# Patient Record
Sex: Female | Born: 1955
Health system: Southern US, Community
[De-identification: ages and names within clinical notes are randomized; demographics above are authoritative.]

## PROBLEM LIST (undated history)

## (undated) DIAGNOSIS — E785 Hyperlipidemia, unspecified: Secondary | ICD-10-CM

## (undated) DIAGNOSIS — I10 Essential (primary) hypertension: Secondary | ICD-10-CM

## (undated) DIAGNOSIS — F32A Depression, unspecified: Secondary | ICD-10-CM

## (undated) DIAGNOSIS — T7840XA Allergy, unspecified, initial encounter: Secondary | ICD-10-CM

## (undated) DIAGNOSIS — F329 Major depressive disorder, single episode, unspecified: Secondary | ICD-10-CM

## (undated) DIAGNOSIS — F419 Anxiety disorder, unspecified: Secondary | ICD-10-CM

## (undated) DIAGNOSIS — G5600 Carpal tunnel syndrome, unspecified upper limb: Secondary | ICD-10-CM

## (undated) DIAGNOSIS — M1990 Unspecified osteoarthritis, unspecified site: Secondary | ICD-10-CM

## (undated) DIAGNOSIS — J45909 Unspecified asthma, uncomplicated: Secondary | ICD-10-CM

## (undated) HISTORY — PX: OTHER SURGICAL HISTORY: SHX169

## (undated) HISTORY — DX: Depression, unspecified: F32.A

## (undated) HISTORY — DX: Major depressive disorder, single episode, unspecified: F32.9

## (undated) HISTORY — PX: DE QUERVAIN'S RELEASE: SHX1439

## (undated) HISTORY — DX: Anxiety disorder, unspecified: F41.9

## (undated) HISTORY — DX: Carpal tunnel syndrome, unspecified upper limb: G56.00

## (undated) HISTORY — DX: Unspecified osteoarthritis, unspecified site: M19.90

## (undated) HISTORY — DX: Hyperlipidemia, unspecified: E78.5

## (undated) HISTORY — PX: CHOLECYSTECTOMY: SHX55

## (undated) HISTORY — DX: Unspecified asthma, uncomplicated: J45.909

## (undated) HISTORY — DX: Essential (primary) hypertension: I10

## (undated) HISTORY — DX: Allergy, unspecified, initial encounter: T78.40XA

---

## 2003-06-07 ENCOUNTER — Other Ambulatory Visit: Payer: Self-pay

## 2004-06-11 ENCOUNTER — Encounter: Payer: Self-pay | Admitting: General Practice

## 2004-06-19 ENCOUNTER — Encounter: Payer: Self-pay | Admitting: General Practice

## 2005-11-15 ENCOUNTER — Emergency Department: Payer: Self-pay | Admitting: Emergency Medicine

## 2006-08-02 ENCOUNTER — Other Ambulatory Visit: Payer: Self-pay

## 2006-08-09 ENCOUNTER — Ambulatory Visit: Payer: Self-pay | Admitting: Surgery

## 2006-09-27 ENCOUNTER — Ambulatory Visit: Payer: Self-pay | Admitting: Unknown Physician Specialty

## 2006-09-30 ENCOUNTER — Ambulatory Visit: Payer: Self-pay | Admitting: Unknown Physician Specialty

## 2006-10-12 ENCOUNTER — Ambulatory Visit: Payer: Self-pay | Admitting: Unknown Physician Specialty

## 2007-11-14 ENCOUNTER — Ambulatory Visit: Payer: Self-pay | Admitting: Unknown Physician Specialty

## 2008-09-27 ENCOUNTER — Encounter: Admission: RE | Admit: 2008-09-27 | Discharge: 2008-09-27 | Payer: Self-pay | Admitting: Podiatry

## 2009-02-06 ENCOUNTER — Ambulatory Visit: Payer: Self-pay | Admitting: Unknown Physician Specialty

## 2010-06-03 ENCOUNTER — Ambulatory Visit: Payer: Self-pay | Admitting: Internal Medicine

## 2010-12-08 ENCOUNTER — Encounter: Payer: Self-pay | Admitting: Family Medicine

## 2010-12-08 ENCOUNTER — Ambulatory Visit (INDEPENDENT_AMBULATORY_CARE_PROVIDER_SITE_OTHER): Payer: Medicare Other | Admitting: Family Medicine

## 2010-12-08 VITALS — BP 113/77 | HR 82 | Ht 65.0 in | Wt 198.0 lb

## 2010-12-08 DIAGNOSIS — M25519 Pain in unspecified shoulder: Secondary | ICD-10-CM

## 2010-12-09 ENCOUNTER — Encounter: Payer: Self-pay | Admitting: Family Medicine

## 2010-12-09 NOTE — Progress Notes (Signed)
  Subjective:    Patient ID: Maureen Wiggins, female    DOB: 05-Jun-1956, 55 y.o.   MRN: [id number]  HPI  Maureen Wiggins is sent by her PCP, Dr Francesco Sor, for an ultrasound evaluation of her left shoulder for possible rotator cuff tear. She reports many issues for several years with her left upper extremity. She has been seen at [facility] and [facility] and has had surgical procedures for : radial nerve entrapment, de Quervains tenosynovitis and carpal tunnel issues---all on te left side. Recently she has been seen by Dr Ernest Pine for problems with left shoulder and elbow pain. Dr Ernest Pine is concerned that she may have a rotator cuff tear. She is unable to get an MRI because she has an implantable device in her spine.    Left shoulder pain is constant, worse with a lot of activity, especially anything over head. Gardening makes it much worse. She is right hand dominant. Not currently working as she is out on disability. Has had no specific trauma to the left shoulder. No unusual weakness of that arm or hand. N0 numbness or tingling in the arm or hand. Pain is mostly in anterior and central part of shoulder with some radiation to elbow and neck. No color changes noted in skin of that  extremity. No swelling.  Review of Systems Pertinent review of systems: negative for fever or unusual weight change.  Also  see HPI for pertinent issues regarding her chief complaint Objective:   Physical Exam     GEN WD female NAD NECK FROM with normal flexion and extension that is painless. Negative spur lings. No LAD. SHOULDERS: symmetrical with no sag. he left. Rotator cuff muscle strength is intact in all planes. Distally neurovascularly intact. Normal muscle bulk and tone of left upper extremity. Decreased IR on left--can barely get her hand behind her hip on the left and on the right she can easily reach L2 area.   [place] limited left shoulder: Rotator cuff muscles are intact. There is a small amount of calcification in the mid  substance of the supraspinatus consistent with old small tear that  has healed. The bicep tendon is visualized and intact. There is some impingement seen on dynamic testing. ha  1) left shoulder pain with some radiation to neck and elbow. Nothing on [place] that would indicate any current rotator cuff issues other than some mild impingement. She might benefit from a subacromial steroid injection and an exercise program for rotator cuff strengthening / IR stretches. I offered that today but she really wanted to discuss with her PCP. I will return her to his care and see her back prn.

## 2010-12-25 ENCOUNTER — Encounter: Payer: Self-pay | Admitting: General Practice

## 2011-01-18 ENCOUNTER — Encounter: Payer: Self-pay | Admitting: General Practice

## 2011-02-18 ENCOUNTER — Encounter: Payer: Self-pay | Admitting: General Practice

## 2011-05-28 ENCOUNTER — Emergency Department: Payer: Self-pay | Admitting: Emergency Medicine

## 2013-02-13 DIAGNOSIS — N951 Menopausal and female climacteric states: Secondary | ICD-10-CM | POA: Insufficient documentation

## 2014-03-08 ENCOUNTER — Ambulatory Visit: Payer: Self-pay | Admitting: Unknown Physician Specialty

## 2014-03-09 LAB — PATHOLOGY REPORT

## 2014-08-27 DIAGNOSIS — E785 Hyperlipidemia, unspecified: Secondary | ICD-10-CM | POA: Insufficient documentation

## 2015-03-26 ENCOUNTER — Other Ambulatory Visit: Payer: Self-pay | Admitting: Orthopedic Surgery

## 2015-03-26 DIAGNOSIS — G8929 Other chronic pain: Secondary | ICD-10-CM

## 2015-03-26 DIAGNOSIS — M545 Low back pain: Principal | ICD-10-CM

## 2015-03-29 ENCOUNTER — Ambulatory Visit
Admission: RE | Admit: 2015-03-29 | Discharge: 2015-03-29 | Disposition: A | Discharge status: Home / Self Care | Payer: PPO | Source: Ambulatory Visit | Attending: Orthopedic Surgery | Admitting: Orthopedic Surgery

## 2015-03-29 DIAGNOSIS — M545 Low back pain: Secondary | ICD-10-CM | POA: Insufficient documentation

## 2015-03-29 DIAGNOSIS — G8929 Other chronic pain: Secondary | ICD-10-CM | POA: Diagnosis present

## 2015-03-29 DIAGNOSIS — M4807 Spinal stenosis, lumbosacral region: Secondary | ICD-10-CM | POA: Diagnosis not present

## 2015-07-23 DIAGNOSIS — J3089 Other allergic rhinitis: Secondary | ICD-10-CM | POA: Diagnosis not present

## 2015-07-23 DIAGNOSIS — J3081 Allergic rhinitis due to animal (cat) (dog) hair and dander: Secondary | ICD-10-CM | POA: Diagnosis not present

## 2015-07-29 DIAGNOSIS — J3089 Other allergic rhinitis: Secondary | ICD-10-CM | POA: Diagnosis not present

## 2015-07-29 DIAGNOSIS — J3081 Allergic rhinitis due to animal (cat) (dog) hair and dander: Secondary | ICD-10-CM | POA: Diagnosis not present

## 2015-08-01 DIAGNOSIS — M5416 Radiculopathy, lumbar region: Secondary | ICD-10-CM | POA: Diagnosis not present

## 2015-08-01 DIAGNOSIS — M4806 Spinal stenosis, lumbar region: Secondary | ICD-10-CM | POA: Diagnosis not present

## 2015-08-04 DIAGNOSIS — M48061 Spinal stenosis, lumbar region without neurogenic claudication: Secondary | ICD-10-CM | POA: Insufficient documentation

## 2015-08-07 DIAGNOSIS — R05 Cough: Secondary | ICD-10-CM | POA: Diagnosis not present

## 2015-08-07 DIAGNOSIS — J019 Acute sinusitis, unspecified: Secondary | ICD-10-CM | POA: Diagnosis not present

## 2015-08-20 DIAGNOSIS — J3081 Allergic rhinitis due to animal (cat) (dog) hair and dander: Secondary | ICD-10-CM | POA: Diagnosis not present

## 2015-08-20 DIAGNOSIS — J3089 Other allergic rhinitis: Secondary | ICD-10-CM | POA: Diagnosis not present

## 2015-08-27 DIAGNOSIS — J3089 Other allergic rhinitis: Secondary | ICD-10-CM | POA: Diagnosis not present

## 2015-08-27 DIAGNOSIS — J3081 Allergic rhinitis due to animal (cat) (dog) hair and dander: Secondary | ICD-10-CM | POA: Diagnosis not present

## 2015-08-29 DIAGNOSIS — M5416 Radiculopathy, lumbar region: Secondary | ICD-10-CM | POA: Diagnosis not present

## 2015-09-17 DIAGNOSIS — J3089 Other allergic rhinitis: Secondary | ICD-10-CM | POA: Diagnosis not present

## 2015-09-17 DIAGNOSIS — J3081 Allergic rhinitis due to animal (cat) (dog) hair and dander: Secondary | ICD-10-CM | POA: Diagnosis not present

## 2015-09-24 DIAGNOSIS — J3081 Allergic rhinitis due to animal (cat) (dog) hair and dander: Secondary | ICD-10-CM | POA: Diagnosis not present

## 2015-09-24 DIAGNOSIS — J3089 Other allergic rhinitis: Secondary | ICD-10-CM | POA: Diagnosis not present

## 2015-09-27 DIAGNOSIS — I10 Essential (primary) hypertension: Secondary | ICD-10-CM | POA: Diagnosis not present

## 2015-09-27 DIAGNOSIS — M5416 Radiculopathy, lumbar region: Secondary | ICD-10-CM | POA: Diagnosis not present

## 2015-09-27 DIAGNOSIS — F419 Anxiety disorder, unspecified: Secondary | ICD-10-CM | POA: Diagnosis not present

## 2015-09-27 DIAGNOSIS — E785 Hyperlipidemia, unspecified: Secondary | ICD-10-CM | POA: Diagnosis not present

## 2015-10-01 DIAGNOSIS — J3081 Allergic rhinitis due to animal (cat) (dog) hair and dander: Secondary | ICD-10-CM | POA: Diagnosis not present

## 2015-10-01 DIAGNOSIS — J3089 Other allergic rhinitis: Secondary | ICD-10-CM | POA: Diagnosis not present

## 2015-10-04 DIAGNOSIS — M4727 Other spondylosis with radiculopathy, lumbosacral region: Secondary | ICD-10-CM | POA: Diagnosis not present

## 2015-10-04 DIAGNOSIS — M4726 Other spondylosis with radiculopathy, lumbar region: Secondary | ICD-10-CM | POA: Diagnosis not present

## 2015-10-04 DIAGNOSIS — M5116 Intervertebral disc disorders with radiculopathy, lumbar region: Secondary | ICD-10-CM | POA: Diagnosis not present

## 2015-10-04 DIAGNOSIS — M5416 Radiculopathy, lumbar region: Secondary | ICD-10-CM | POA: Diagnosis not present

## 2015-10-10 DIAGNOSIS — J3081 Allergic rhinitis due to animal (cat) (dog) hair and dander: Secondary | ICD-10-CM | POA: Diagnosis not present

## 2015-10-10 DIAGNOSIS — J3089 Other allergic rhinitis: Secondary | ICD-10-CM | POA: Diagnosis not present

## 2015-10-22 DIAGNOSIS — B9689 Other specified bacterial agents as the cause of diseases classified elsewhere: Secondary | ICD-10-CM | POA: Diagnosis not present

## 2015-10-22 DIAGNOSIS — J019 Acute sinusitis, unspecified: Secondary | ICD-10-CM | POA: Diagnosis not present

## 2015-10-22 DIAGNOSIS — G8929 Other chronic pain: Secondary | ICD-10-CM | POA: Diagnosis not present

## 2015-10-22 DIAGNOSIS — M79602 Pain in left arm: Secondary | ICD-10-CM | POA: Diagnosis not present

## 2015-10-22 DIAGNOSIS — F411 Generalized anxiety disorder: Secondary | ICD-10-CM | POA: Diagnosis not present

## 2015-10-24 DIAGNOSIS — J3089 Other allergic rhinitis: Secondary | ICD-10-CM | POA: Diagnosis not present

## 2015-10-24 DIAGNOSIS — J3081 Allergic rhinitis due to animal (cat) (dog) hair and dander: Secondary | ICD-10-CM | POA: Diagnosis not present

## 2015-10-29 DIAGNOSIS — J3081 Allergic rhinitis due to animal (cat) (dog) hair and dander: Secondary | ICD-10-CM | POA: Diagnosis not present

## 2015-10-29 DIAGNOSIS — J3089 Other allergic rhinitis: Secondary | ICD-10-CM | POA: Diagnosis not present

## 2015-10-31 DIAGNOSIS — M5416 Radiculopathy, lumbar region: Secondary | ICD-10-CM | POA: Diagnosis not present

## 2015-11-05 DIAGNOSIS — J3081 Allergic rhinitis due to animal (cat) (dog) hair and dander: Secondary | ICD-10-CM | POA: Diagnosis not present

## 2015-11-05 DIAGNOSIS — J3089 Other allergic rhinitis: Secondary | ICD-10-CM | POA: Diagnosis not present

## 2015-11-13 DIAGNOSIS — J0100 Acute maxillary sinusitis, unspecified: Secondary | ICD-10-CM | POA: Diagnosis not present

## 2015-11-19 DIAGNOSIS — J3089 Other allergic rhinitis: Secondary | ICD-10-CM | POA: Diagnosis not present

## 2015-11-19 DIAGNOSIS — J3081 Allergic rhinitis due to animal (cat) (dog) hair and dander: Secondary | ICD-10-CM | POA: Diagnosis not present

## 2015-11-21 DIAGNOSIS — I10 Essential (primary) hypertension: Secondary | ICD-10-CM | POA: Diagnosis not present

## 2015-11-21 DIAGNOSIS — M5416 Radiculopathy, lumbar region: Secondary | ICD-10-CM | POA: Diagnosis not present

## 2015-11-21 DIAGNOSIS — Z01818 Encounter for other preprocedural examination: Secondary | ICD-10-CM | POA: Diagnosis not present

## 2015-11-21 DIAGNOSIS — F418 Other specified anxiety disorders: Secondary | ICD-10-CM | POA: Diagnosis not present

## 2015-11-21 DIAGNOSIS — Z419 Encounter for procedure for purposes other than remedying health state, unspecified: Secondary | ICD-10-CM | POA: Diagnosis not present

## 2015-11-21 DIAGNOSIS — K219 Gastro-esophageal reflux disease without esophagitis: Secondary | ICD-10-CM | POA: Diagnosis not present

## 2015-11-21 DIAGNOSIS — Z01812 Encounter for preprocedural laboratory examination: Secondary | ICD-10-CM | POA: Diagnosis not present

## 2015-11-26 DIAGNOSIS — J3089 Other allergic rhinitis: Secondary | ICD-10-CM | POA: Diagnosis not present

## 2015-12-11 DIAGNOSIS — M5127 Other intervertebral disc displacement, lumbosacral region: Secondary | ICD-10-CM | POA: Diagnosis not present

## 2015-12-11 DIAGNOSIS — F419 Anxiety disorder, unspecified: Secondary | ICD-10-CM | POA: Diagnosis not present

## 2015-12-11 DIAGNOSIS — M5416 Radiculopathy, lumbar region: Secondary | ICD-10-CM | POA: Diagnosis not present

## 2015-12-11 DIAGNOSIS — K219 Gastro-esophageal reflux disease without esophagitis: Secondary | ICD-10-CM | POA: Diagnosis not present

## 2015-12-11 DIAGNOSIS — M5136 Other intervertebral disc degeneration, lumbar region: Secondary | ICD-10-CM | POA: Diagnosis not present

## 2015-12-11 DIAGNOSIS — E785 Hyperlipidemia, unspecified: Secondary | ICD-10-CM | POA: Diagnosis not present

## 2015-12-11 DIAGNOSIS — I10 Essential (primary) hypertension: Secondary | ICD-10-CM | POA: Diagnosis not present

## 2015-12-11 DIAGNOSIS — M5117 Intervertebral disc disorders with radiculopathy, lumbosacral region: Secondary | ICD-10-CM | POA: Diagnosis not present

## 2015-12-11 DIAGNOSIS — Z87891 Personal history of nicotine dependence: Secondary | ICD-10-CM | POA: Diagnosis not present

## 2015-12-11 DIAGNOSIS — F329 Major depressive disorder, single episode, unspecified: Secondary | ICD-10-CM | POA: Diagnosis not present

## 2015-12-11 DIAGNOSIS — Z79899 Other long term (current) drug therapy: Secondary | ICD-10-CM | POA: Diagnosis not present

## 2015-12-12 DIAGNOSIS — K219 Gastro-esophageal reflux disease without esophagitis: Secondary | ICD-10-CM | POA: Diagnosis not present

## 2015-12-12 DIAGNOSIS — F419 Anxiety disorder, unspecified: Secondary | ICD-10-CM | POA: Diagnosis not present

## 2015-12-12 DIAGNOSIS — M5416 Radiculopathy, lumbar region: Secondary | ICD-10-CM | POA: Diagnosis not present

## 2015-12-12 DIAGNOSIS — E785 Hyperlipidemia, unspecified: Secondary | ICD-10-CM | POA: Diagnosis not present

## 2015-12-12 DIAGNOSIS — I10 Essential (primary) hypertension: Secondary | ICD-10-CM | POA: Diagnosis not present

## 2015-12-26 DIAGNOSIS — I10 Essential (primary) hypertension: Secondary | ICD-10-CM | POA: Diagnosis not present

## 2015-12-26 DIAGNOSIS — M5417 Radiculopathy, lumbosacral region: Secondary | ICD-10-CM | POA: Diagnosis not present

## 2015-12-26 DIAGNOSIS — M5416 Radiculopathy, lumbar region: Secondary | ICD-10-CM | POA: Diagnosis not present

## 2015-12-26 DIAGNOSIS — Z87891 Personal history of nicotine dependence: Secondary | ICD-10-CM | POA: Diagnosis not present

## 2015-12-26 DIAGNOSIS — Z8249 Family history of ischemic heart disease and other diseases of the circulatory system: Secondary | ICD-10-CM | POA: Diagnosis not present

## 2015-12-27 DIAGNOSIS — R339 Retention of urine, unspecified: Secondary | ICD-10-CM | POA: Diagnosis not present

## 2016-01-02 DIAGNOSIS — N951 Menopausal and female climacteric states: Secondary | ICD-10-CM | POA: Diagnosis not present

## 2016-01-02 DIAGNOSIS — Z79890 Hormone replacement therapy (postmenopausal): Secondary | ICD-10-CM | POA: Diagnosis not present

## 2016-01-02 DIAGNOSIS — Z90710 Acquired absence of both cervix and uterus: Secondary | ICD-10-CM | POA: Diagnosis not present

## 2016-01-02 DIAGNOSIS — F329 Major depressive disorder, single episode, unspecified: Secondary | ICD-10-CM | POA: Diagnosis not present

## 2016-01-02 DIAGNOSIS — Z0000 Encounter for general adult medical examination without abnormal findings: Secondary | ICD-10-CM | POA: Diagnosis not present

## 2016-01-07 DIAGNOSIS — J3081 Allergic rhinitis due to animal (cat) (dog) hair and dander: Secondary | ICD-10-CM | POA: Diagnosis not present

## 2016-01-07 DIAGNOSIS — J3089 Other allergic rhinitis: Secondary | ICD-10-CM | POA: Diagnosis not present

## 2016-01-14 DIAGNOSIS — J3089 Other allergic rhinitis: Secondary | ICD-10-CM | POA: Diagnosis not present

## 2016-01-14 DIAGNOSIS — J3081 Allergic rhinitis due to animal (cat) (dog) hair and dander: Secondary | ICD-10-CM | POA: Diagnosis not present

## 2016-01-16 DIAGNOSIS — Z9889 Other specified postprocedural states: Secondary | ICD-10-CM | POA: Diagnosis not present

## 2016-01-16 DIAGNOSIS — J3089 Other allergic rhinitis: Secondary | ICD-10-CM | POA: Diagnosis not present

## 2016-01-16 DIAGNOSIS — M5416 Radiculopathy, lumbar region: Secondary | ICD-10-CM | POA: Diagnosis not present

## 2016-01-16 DIAGNOSIS — J3081 Allergic rhinitis due to animal (cat) (dog) hair and dander: Secondary | ICD-10-CM | POA: Diagnosis not present

## 2016-01-23 DIAGNOSIS — J3089 Other allergic rhinitis: Secondary | ICD-10-CM | POA: Diagnosis not present

## 2016-01-23 DIAGNOSIS — J3081 Allergic rhinitis due to animal (cat) (dog) hair and dander: Secondary | ICD-10-CM | POA: Diagnosis not present

## 2016-01-28 DIAGNOSIS — J3081 Allergic rhinitis due to animal (cat) (dog) hair and dander: Secondary | ICD-10-CM | POA: Diagnosis not present

## 2016-01-28 DIAGNOSIS — J3089 Other allergic rhinitis: Secondary | ICD-10-CM | POA: Diagnosis not present

## 2016-02-04 DIAGNOSIS — N398 Other specified disorders of urinary system: Secondary | ICD-10-CM | POA: Diagnosis not present

## 2016-02-04 DIAGNOSIS — R3914 Feeling of incomplete bladder emptying: Secondary | ICD-10-CM | POA: Diagnosis not present

## 2016-02-13 DIAGNOSIS — J3081 Allergic rhinitis due to animal (cat) (dog) hair and dander: Secondary | ICD-10-CM | POA: Diagnosis not present

## 2016-02-13 DIAGNOSIS — J3089 Other allergic rhinitis: Secondary | ICD-10-CM | POA: Diagnosis not present

## 2016-02-18 DIAGNOSIS — J3081 Allergic rhinitis due to animal (cat) (dog) hair and dander: Secondary | ICD-10-CM | POA: Diagnosis not present

## 2016-02-18 DIAGNOSIS — J3089 Other allergic rhinitis: Secondary | ICD-10-CM | POA: Diagnosis not present

## 2016-02-27 DIAGNOSIS — Z9889 Other specified postprocedural states: Secondary | ICD-10-CM | POA: Diagnosis not present

## 2016-03-03 DIAGNOSIS — H1045 Other chronic allergic conjunctivitis: Secondary | ICD-10-CM | POA: Diagnosis not present

## 2016-03-03 DIAGNOSIS — J4530 Mild persistent asthma, uncomplicated: Secondary | ICD-10-CM | POA: Diagnosis not present

## 2016-03-03 DIAGNOSIS — J3081 Allergic rhinitis due to animal (cat) (dog) hair and dander: Secondary | ICD-10-CM | POA: Diagnosis not present

## 2016-03-03 DIAGNOSIS — J3089 Other allergic rhinitis: Secondary | ICD-10-CM | POA: Diagnosis not present

## 2016-03-12 DIAGNOSIS — J3089 Other allergic rhinitis: Secondary | ICD-10-CM | POA: Diagnosis not present

## 2016-03-12 DIAGNOSIS — J3081 Allergic rhinitis due to animal (cat) (dog) hair and dander: Secondary | ICD-10-CM | POA: Diagnosis not present

## 2016-03-17 DIAGNOSIS — J3089 Other allergic rhinitis: Secondary | ICD-10-CM | POA: Diagnosis not present

## 2016-03-17 DIAGNOSIS — J3081 Allergic rhinitis due to animal (cat) (dog) hair and dander: Secondary | ICD-10-CM | POA: Diagnosis not present

## 2016-03-24 DIAGNOSIS — J3089 Other allergic rhinitis: Secondary | ICD-10-CM | POA: Diagnosis not present

## 2016-03-24 DIAGNOSIS — J3081 Allergic rhinitis due to animal (cat) (dog) hair and dander: Secondary | ICD-10-CM | POA: Diagnosis not present

## 2016-04-01 DIAGNOSIS — I10 Essential (primary) hypertension: Secondary | ICD-10-CM | POA: Diagnosis not present

## 2016-04-01 DIAGNOSIS — Z0000 Encounter for general adult medical examination without abnormal findings: Secondary | ICD-10-CM | POA: Diagnosis not present

## 2016-04-07 DIAGNOSIS — J3089 Other allergic rhinitis: Secondary | ICD-10-CM | POA: Diagnosis not present

## 2016-04-07 DIAGNOSIS — J3081 Allergic rhinitis due to animal (cat) (dog) hair and dander: Secondary | ICD-10-CM | POA: Diagnosis not present

## 2016-04-08 DIAGNOSIS — Z0000 Encounter for general adult medical examination without abnormal findings: Secondary | ICD-10-CM | POA: Diagnosis not present

## 2016-04-08 DIAGNOSIS — F411 Generalized anxiety disorder: Secondary | ICD-10-CM | POA: Diagnosis not present

## 2016-04-16 DIAGNOSIS — J3089 Other allergic rhinitis: Secondary | ICD-10-CM | POA: Diagnosis not present

## 2016-04-16 DIAGNOSIS — J3081 Allergic rhinitis due to animal (cat) (dog) hair and dander: Secondary | ICD-10-CM | POA: Diagnosis not present

## 2016-04-21 DIAGNOSIS — J3089 Other allergic rhinitis: Secondary | ICD-10-CM | POA: Diagnosis not present

## 2016-04-21 DIAGNOSIS — J3081 Allergic rhinitis due to animal (cat) (dog) hair and dander: Secondary | ICD-10-CM | POA: Diagnosis not present

## 2016-04-30 DIAGNOSIS — J3081 Allergic rhinitis due to animal (cat) (dog) hair and dander: Secondary | ICD-10-CM | POA: Diagnosis not present

## 2016-04-30 DIAGNOSIS — J3089 Other allergic rhinitis: Secondary | ICD-10-CM | POA: Diagnosis not present

## 2016-05-01 DIAGNOSIS — M8580 Other specified disorders of bone density and structure, unspecified site: Secondary | ICD-10-CM | POA: Diagnosis not present

## 2016-05-05 DIAGNOSIS — J3089 Other allergic rhinitis: Secondary | ICD-10-CM | POA: Diagnosis not present

## 2016-05-05 DIAGNOSIS — J3081 Allergic rhinitis due to animal (cat) (dog) hair and dander: Secondary | ICD-10-CM | POA: Diagnosis not present

## 2016-05-12 DIAGNOSIS — J3081 Allergic rhinitis due to animal (cat) (dog) hair and dander: Secondary | ICD-10-CM | POA: Diagnosis not present

## 2016-05-12 DIAGNOSIS — J3089 Other allergic rhinitis: Secondary | ICD-10-CM | POA: Diagnosis not present

## 2016-05-26 DIAGNOSIS — J3081 Allergic rhinitis due to animal (cat) (dog) hair and dander: Secondary | ICD-10-CM | POA: Diagnosis not present

## 2016-05-26 DIAGNOSIS — J3089 Other allergic rhinitis: Secondary | ICD-10-CM | POA: Diagnosis not present

## 2016-06-01 DIAGNOSIS — R3914 Feeling of incomplete bladder emptying: Secondary | ICD-10-CM | POA: Diagnosis not present

## 2016-06-04 DIAGNOSIS — J3089 Other allergic rhinitis: Secondary | ICD-10-CM | POA: Diagnosis not present

## 2016-06-04 DIAGNOSIS — J3081 Allergic rhinitis due to animal (cat) (dog) hair and dander: Secondary | ICD-10-CM | POA: Diagnosis not present

## 2016-06-08 DIAGNOSIS — R3914 Feeling of incomplete bladder emptying: Secondary | ICD-10-CM | POA: Diagnosis not present

## 2016-06-09 DIAGNOSIS — J3089 Other allergic rhinitis: Secondary | ICD-10-CM | POA: Diagnosis not present

## 2016-06-09 DIAGNOSIS — J3081 Allergic rhinitis due to animal (cat) (dog) hair and dander: Secondary | ICD-10-CM | POA: Diagnosis not present

## 2016-06-10 DIAGNOSIS — Z1231 Encounter for screening mammogram for malignant neoplasm of breast: Secondary | ICD-10-CM | POA: Diagnosis not present

## 2016-06-15 DIAGNOSIS — R3914 Feeling of incomplete bladder emptying: Secondary | ICD-10-CM | POA: Diagnosis not present

## 2016-06-16 DIAGNOSIS — J3081 Allergic rhinitis due to animal (cat) (dog) hair and dander: Secondary | ICD-10-CM | POA: Diagnosis not present

## 2016-06-16 DIAGNOSIS — J3089 Other allergic rhinitis: Secondary | ICD-10-CM | POA: Diagnosis not present

## 2016-06-22 DIAGNOSIS — R3914 Feeling of incomplete bladder emptying: Secondary | ICD-10-CM | POA: Diagnosis not present

## 2016-06-23 DIAGNOSIS — J3081 Allergic rhinitis due to animal (cat) (dog) hair and dander: Secondary | ICD-10-CM | POA: Diagnosis not present

## 2016-06-23 DIAGNOSIS — J3089 Other allergic rhinitis: Secondary | ICD-10-CM | POA: Diagnosis not present

## 2016-06-29 DIAGNOSIS — R3914 Feeling of incomplete bladder emptying: Secondary | ICD-10-CM | POA: Diagnosis not present

## 2016-06-30 DIAGNOSIS — J3089 Other allergic rhinitis: Secondary | ICD-10-CM | POA: Diagnosis not present

## 2016-06-30 DIAGNOSIS — J3081 Allergic rhinitis due to animal (cat) (dog) hair and dander: Secondary | ICD-10-CM | POA: Diagnosis not present

## 2016-07-07 DIAGNOSIS — J3089 Other allergic rhinitis: Secondary | ICD-10-CM | POA: Diagnosis not present

## 2016-07-07 DIAGNOSIS — J3081 Allergic rhinitis due to animal (cat) (dog) hair and dander: Secondary | ICD-10-CM | POA: Diagnosis not present

## 2016-07-16 DIAGNOSIS — J3081 Allergic rhinitis due to animal (cat) (dog) hair and dander: Secondary | ICD-10-CM | POA: Diagnosis not present

## 2016-07-16 DIAGNOSIS — J3089 Other allergic rhinitis: Secondary | ICD-10-CM | POA: Diagnosis not present

## 2016-07-17 DIAGNOSIS — N398 Other specified disorders of urinary system: Secondary | ICD-10-CM | POA: Diagnosis not present

## 2016-07-25 ENCOUNTER — Emergency Department: Payer: PPO

## 2016-07-25 ENCOUNTER — Emergency Department
Admission: EM | Admit: 2016-07-25 | Discharge: 2016-07-25 | Disposition: A | Discharge status: Home / Self Care | Payer: PPO | Attending: Emergency Medicine | Admitting: Emergency Medicine

## 2016-07-25 ENCOUNTER — Encounter: Payer: Self-pay | Admitting: Emergency Medicine

## 2016-07-25 DIAGNOSIS — S93401A Sprain of unspecified ligament of right ankle, initial encounter: Secondary | ICD-10-CM

## 2016-07-25 DIAGNOSIS — T148XXA Other injury of unspecified body region, initial encounter: Secondary | ICD-10-CM

## 2016-07-25 DIAGNOSIS — Y9301 Activity, walking, marching and hiking: Secondary | ICD-10-CM | POA: Insufficient documentation

## 2016-07-25 DIAGNOSIS — S8261XA Displaced fracture of lateral malleolus of right fibula, initial encounter for closed fracture: Secondary | ICD-10-CM | POA: Insufficient documentation

## 2016-07-25 DIAGNOSIS — S92511A Displaced fracture of proximal phalanx of right lesser toe(s), initial encounter for closed fracture: Secondary | ICD-10-CM | POA: Diagnosis not present

## 2016-07-25 DIAGNOSIS — X501XXA Overexertion from prolonged static or awkward postures, initial encounter: Secondary | ICD-10-CM | POA: Diagnosis not present

## 2016-07-25 DIAGNOSIS — Y999 Unspecified external cause status: Secondary | ICD-10-CM | POA: Diagnosis not present

## 2016-07-25 DIAGNOSIS — Y929 Unspecified place or not applicable: Secondary | ICD-10-CM | POA: Diagnosis not present

## 2016-07-25 DIAGNOSIS — S99911A Unspecified injury of right ankle, initial encounter: Secondary | ICD-10-CM | POA: Diagnosis not present

## 2016-07-25 MED ORDER — TRAMADOL HCL 50 MG PO TABS: 50.0000 mg | ORAL_TABLET | Freq: Four times a day (QID) | ORAL | 0 refills | Status: AC | PRN

## 2016-07-25 MED ORDER — TRAMADOL HCL 50 MG PO TABS
50.0000 mg | ORAL_TABLET | Freq: Once | ORAL | Status: AC
  Administered 2016-07-25: 50 mg via ORAL
  Filled 2016-07-25: qty 1

## 2016-07-25 MED ORDER — ACETAMINOPHEN 325 MG PO TABS: 650.0000 mg | ORAL_TABLET | Freq: Once | ORAL | Status: DC

## 2016-07-25 NOTE — ED Triage Notes (Signed)
Pt rolled R ankle inward, has brusing and swelling to lateral R foot per report. Poorly mobile at this time secondary to painful weight bearing at this time. Pt to Stat desk in [facility].

## 2016-07-25 NOTE — ED Provider Notes (Signed)
[facility] Emergency Department Provider Note  ____________________________________________  Time seen: Approximately 9:17 PM  I have reviewed the triage vital signs and the nursing notes.   HISTORY  Chief Complaint Ankle Injury and Ankle Pain    HPI Maureen Wiggins is a 61 y.o. female that presents to the emergency department with right ankle swelling and pain. Patient states that she was walking up and down the stairs outside and on her second trip down, missed a stair and fell, twisting her ankle. Patient denies hitting head or loss of consciousness. Patient is able to move toes and denies any loss of sensation. Patient has not walked since incident. Patient has not taken anything for pain. Patient has sprained this ankle multiple times.   Past Medical History:  Diagnosis Date  . Carpal tunnel syndrome     There are no active problems to display for this patient.   Past Surgical History:  Procedure Laterality Date  . DE QUERVAIN'S RELEASE    . radial nerve entrapment      Prior to Admission medications   Medication Sig Start Date End Date Taking? Authorizing Provider  albuterol (PROVENTIL HFA;VENTOLIN HFA) 108 (90 BASE) MCG/ACT inhaler Inhale 2 puffs into the lungs as needed.      Historical Provider, MD  ALPRAZolam Duanne Moron) 0.25 MG tablet Take 0.25 mg by mouth every 8 (eight) hours as needed.      Historical Provider, MD  amLODipine (NORVASC) 5 MG tablet Take 5 mg by mouth daily.      Historical Provider, MD  BUPROPION HCL, SR, PO Take 150 mg by mouth daily.      Historical Provider, MD  Docusate Calcium (STOOL SOFTENER PO) Take by mouth as needed.      Historical Provider, MD  EPINEPHrine (EPI-PEN) 0.3 mg/0.3 mL DEVI Inject 0.3 mg into the muscle once. For emergency allergic reaction.     Historical Provider, MD  estradiol (ESTRACE) 1 MG tablet Take 2 mg by mouth daily.      Historical Provider, MD  fish oil-omega-3 fatty acids 1000 MG capsule Take  1,200 mg by mouth daily.      Historical Provider, MD  fluticasone (FLONASE) 50 MCG/ACT nasal spray 1 spray by Nasal route daily.      Historical Provider, MD  gabapentin (NEURONTIN) 600 MG tablet Take 600 mg by mouth 4 (four) times daily.      Historical Provider, MD  HYDROcodone-acetaminophen (VICODIN) 5-500 MG per tablet Take 1 tablet by mouth every 8 (eight) hours as needed.      Historical Provider, MD  hyoscyamine (LEVSIN, ANASPAZ) 0.125 MG tablet Take 0.125 mg by mouth as needed.      Historical Provider, MD  levocetirizine (XYZAL) 5 MG tablet Take 5 mg by mouth at bedtime.      Historical Provider, MD  meloxicam (MOBIC) 15 MG tablet Take 15 mg by mouth daily as needed.      Historical Provider, MD  montelukast (SINGULAIR) 10 MG tablet Take 10 mg by mouth at bedtime.      Historical Provider, MD  omeprazole (PRILOSEC) 20 MG capsule Take 20 mg by mouth 2 (two) times daily.      Historical Provider, MD  simvastatin (ZOCOR) 20 MG tablet Take 20 mg by mouth daily.      Historical Provider, MD  traMADol (ULTRAM) 50 MG tablet Take 1 tablet (50 mg total) by mouth every 6 (six) hours as needed. 07/25/16 07/25/17  Laban Emperor, PA-C  triamterene-hydrochlorothiazide (MAXZIDE-25) 37.5-25 MG per tablet Take 1 tablet by mouth daily.      Historical Provider, MD  venlafaxine (EFFEXOR) 75 MG tablet Take 150 mg by mouth 2 (two) times daily.      Historical Provider, MD    Allergies Prednisone  No family history on file.  Social History Social History  Substance Use Topics  . Smoking status: Never Smoker  . Smokeless tobacco: Never Used  . Alcohol use Not on file     Review of Systems  Constitutional: No fever/chills ENT: No upper respiratory complaints. Cardiovascular: No chest pain. Respiratory: No cough. No SOB. Gastrointestinal: No abdominal pain.  No nausea, no vomiting.  Musculoskeletal: Pain and swelling over right ankle. Skin: Negative for rash, abrasions, lacerations,  ecchymosis. Neurological: Negative for headaches, numbness or tingling   ____________________________________________   PHYSICAL EXAM:  VITAL SIGNS: ED Triage Vitals  Enc Vitals Group     BP 07/25/16 1944 128/78     Pulse Rate 07/25/16 1944 81     Resp 07/25/16 1944 20     Temp 07/25/16 1944 99.1 F (37.3 C)     Temp Source 07/25/16 1944 Oral     SpO2 07/25/16 1944 98 %     Weight 07/25/16 1943 180 lb (81.6 kg)     Height 07/25/16 1943 5\' 4"  (1.626 m)     Head Circumference --      Peak Flow --      Pain Score 07/25/16 1943 7     Pain Loc --      Pain Edu? --      Excl. in [place]? --      Constitutional: Alert and oriented. Well appearing and in no acute distress. Eyes: Conjunctivae are normal. PERRL. EOMI. Head: Atraumatic. ENT:      Ears:      Nose: No congestion/rhinnorhea.      Mouth/Throat: Mucous membranes are moist.  Neck: No stridor.   Cardiovascular: Normal rate, regular rhythm. Normal S1 and S2.  Good peripheral circulation. 2+ dorsalis pedis and posterior tibialis pulses. Respiratory: Normal respiratory effort without tachypnea or retractions. Lungs CTAB. Good air entry to the bases with no decreased or absent breath sounds. Musculoskeletal: Swelling over right lateral malleolus. Limited range of motion of right ankle. Tenderness to palpation over lateral malleolus. Neurologic:  Normal speech and language. No gross focal neurologic deficits are appreciated. Sensation of toes intact.  Skin:  Skin is warm, dry and intact. No rash noted. Psychiatric: Mood and affect are normal. Speech and behavior are normal. Patient exhibits appropriate insight and judgement.   ____________________________________________   LABS (all labs ordered are listed, but only abnormal results are displayed)  Labs Reviewed - No data to display ____________________________________________  EKG   ____________________________________________  RADIOLOGY Robinette Haines, personally  viewed and evaluated these images (plain radiographs) as part of my medical decision making, as well as reviewing the written report by the radiologist.  Dg Ankle Complete Right  Result Date: 07/25/2016 CLINICAL DATA:  61 year old who fell while going down the stairs into her garage and injured the left ankle. Initial encounter. EXAM: RIGHT ANKLE - COMPLETE 3+ VIEW COMPARISON:  None. FINDINGS: Marked lateral and dorsal soft tissue swelling. Avulsion fracture involving the tip of the lateral malleolus. Accessory ossicle distal to the tip of the lateral malleolus. No other fractures. Ankle mortise intact with well-preserved joint space. Bone mineral density well preserved for age. Large joint effusion/hemarthrosis. Small plantar calcaneal spur. IMPRESSION: 1. Avulsion  fracture involving the tip of the lateral malleolus. There is also an accessory ossicle distal to the tip of the lateral malleolus. 2. Large joint effusion/hemarthrosis. 3. Small plantar calcaneal spur. Electronically Signed   By: Evangeline Dakin M.D.   On: 07/25/2016 20:09    ____________________________________________    PROCEDURES  Procedure(s) performed:    Procedures    Medications  traMADol (ULTRAM) tablet 50 mg (not administered)     ____________________________________________   INITIAL IMPRESSION / ASSESSMENT AND PLAN / ED COURSE  Pertinent labs & imaging results that were available during my care of the patient were reviewed by me and considered in my medical decision making (see chart for details).  Review of the [place] CSRS was performed in accordance of the [place] prior to dispensing any controlled drugs.  Clinical Course     Patient's diagnosis is consistent with ankle sprain and avulsion fracture. Patient ankle was ace wrapped in ED, as patient was unable to tolerate splint. Patient was given crutches and was instructed to stay off ankle. Patient was instructed to wear supportive boots. Patient will be  discharged home with prescriptions for tramadol. Patient is to follow up with orthopedics as directed. Patient is given ED precautions to return to the ED for any worsening or new symptoms.     ____________________________________________  FINAL CLINICAL IMPRESSION(S) / ED DIAGNOSES  Final diagnoses:  Sprain of right ankle, unspecified ligament, initial encounter  Avulsion fracture      NEW MEDICATIONS STARTED DURING THIS VISIT:  New Prescriptions   TRAMADOL (ULTRAM) 50 MG TABLET    Take 1 tablet (50 mg total) by mouth every 6 (six) hours as needed.        This chart was dictated using voice recognition software/Dragon. Despite best efforts to proofread, errors can occur which can change the meaning. Any change was purely unintentional.     Laban Emperor, PA-C 07/25/16 [place], MD 07/26/16 [phone]

## 2016-07-25 NOTE — ED Notes (Signed)
Pt stated that ankle splint too painful. This tech tried removing inside pad and re wrapping but still too painful. PA approved just applying ace wrap.

## 2016-07-25 NOTE — ED Notes (Signed)
Patient to ED for right ankle injury. States she is unsure exactly what she did but lateral malleolus is swollen, and painful to touch. Patient is unable to weight bear due to pain. PUlses intact, distal motor/sensation intact.

## 2016-07-29 DIAGNOSIS — M7989 Other specified soft tissue disorders: Secondary | ICD-10-CM | POA: Diagnosis not present

## 2016-07-29 DIAGNOSIS — S93401A Sprain of unspecified ligament of right ankle, initial encounter: Secondary | ICD-10-CM | POA: Diagnosis not present

## 2016-07-29 DIAGNOSIS — M25571 Pain in right ankle and joints of right foot: Secondary | ICD-10-CM | POA: Diagnosis not present

## 2016-08-18 DIAGNOSIS — J3081 Allergic rhinitis due to animal (cat) (dog) hair and dander: Secondary | ICD-10-CM | POA: Diagnosis not present

## 2016-08-18 DIAGNOSIS — J3089 Other allergic rhinitis: Secondary | ICD-10-CM | POA: Diagnosis not present

## 2016-08-19 DIAGNOSIS — S82891D Other fracture of right lower leg, subsequent encounter for closed fracture with routine healing: Secondary | ICD-10-CM | POA: Diagnosis not present

## 2016-09-08 DIAGNOSIS — J3081 Allergic rhinitis due to animal (cat) (dog) hair and dander: Secondary | ICD-10-CM | POA: Diagnosis not present

## 2016-09-08 DIAGNOSIS — J3089 Other allergic rhinitis: Secondary | ICD-10-CM | POA: Diagnosis not present

## 2016-09-16 DIAGNOSIS — S82891D Other fracture of right lower leg, subsequent encounter for closed fracture with routine healing: Secondary | ICD-10-CM | POA: Diagnosis not present

## 2016-09-24 DIAGNOSIS — J3081 Allergic rhinitis due to animal (cat) (dog) hair and dander: Secondary | ICD-10-CM | POA: Diagnosis not present

## 2016-09-24 DIAGNOSIS — J3089 Other allergic rhinitis: Secondary | ICD-10-CM | POA: Diagnosis not present

## 2016-10-02 DIAGNOSIS — R937 Abnormal findings on diagnostic imaging of other parts of musculoskeletal system: Secondary | ICD-10-CM | POA: Diagnosis not present

## 2016-10-02 DIAGNOSIS — S82891D Other fracture of right lower leg, subsequent encounter for closed fracture with routine healing: Secondary | ICD-10-CM | POA: Diagnosis not present

## 2016-10-02 DIAGNOSIS — M79661 Pain in right lower leg: Secondary | ICD-10-CM | POA: Diagnosis not present

## 2016-10-02 DIAGNOSIS — S82891S Other fracture of right lower leg, sequela: Secondary | ICD-10-CM | POA: Diagnosis not present

## 2016-10-02 DIAGNOSIS — X58XXXD Exposure to other specified factors, subsequent encounter: Secondary | ICD-10-CM | POA: Diagnosis not present

## 2016-10-02 DIAGNOSIS — M25571 Pain in right ankle and joints of right foot: Secondary | ICD-10-CM | POA: Diagnosis not present

## 2016-10-07 DIAGNOSIS — F411 Generalized anxiety disorder: Secondary | ICD-10-CM | POA: Diagnosis not present

## 2016-10-07 DIAGNOSIS — J019 Acute sinusitis, unspecified: Secondary | ICD-10-CM | POA: Diagnosis not present

## 2016-10-07 DIAGNOSIS — I10 Essential (primary) hypertension: Secondary | ICD-10-CM | POA: Diagnosis not present

## 2016-10-07 DIAGNOSIS — F329 Major depressive disorder, single episode, unspecified: Secondary | ICD-10-CM | POA: Diagnosis not present

## 2016-10-07 DIAGNOSIS — J309 Allergic rhinitis, unspecified: Secondary | ICD-10-CM | POA: Diagnosis not present

## 2016-10-07 DIAGNOSIS — E785 Hyperlipidemia, unspecified: Secondary | ICD-10-CM | POA: Diagnosis not present

## 2016-10-08 DIAGNOSIS — E785 Hyperlipidemia, unspecified: Secondary | ICD-10-CM | POA: Diagnosis not present

## 2016-10-08 DIAGNOSIS — I10 Essential (primary) hypertension: Secondary | ICD-10-CM | POA: Diagnosis not present

## 2016-10-13 DIAGNOSIS — J3089 Other allergic rhinitis: Secondary | ICD-10-CM | POA: Diagnosis not present

## 2016-10-13 DIAGNOSIS — J3081 Allergic rhinitis due to animal (cat) (dog) hair and dander: Secondary | ICD-10-CM | POA: Diagnosis not present

## 2016-10-20 DIAGNOSIS — J3081 Allergic rhinitis due to animal (cat) (dog) hair and dander: Secondary | ICD-10-CM | POA: Diagnosis not present

## 2016-10-20 DIAGNOSIS — J3089 Other allergic rhinitis: Secondary | ICD-10-CM | POA: Diagnosis not present

## 2016-10-27 DIAGNOSIS — J3089 Other allergic rhinitis: Secondary | ICD-10-CM | POA: Diagnosis not present

## 2016-10-27 DIAGNOSIS — J3081 Allergic rhinitis due to animal (cat) (dog) hair and dander: Secondary | ICD-10-CM | POA: Diagnosis not present

## 2016-11-03 DIAGNOSIS — J3081 Allergic rhinitis due to animal (cat) (dog) hair and dander: Secondary | ICD-10-CM | POA: Diagnosis not present

## 2016-11-03 DIAGNOSIS — J3089 Other allergic rhinitis: Secondary | ICD-10-CM | POA: Diagnosis not present

## 2016-11-04 DIAGNOSIS — R748 Abnormal levels of other serum enzymes: Secondary | ICD-10-CM | POA: Diagnosis not present

## 2016-11-04 DIAGNOSIS — E876 Hypokalemia: Secondary | ICD-10-CM | POA: Diagnosis not present

## 2016-11-09 DIAGNOSIS — M5416 Radiculopathy, lumbar region: Secondary | ICD-10-CM | POA: Diagnosis not present

## 2016-11-17 DIAGNOSIS — J3089 Other allergic rhinitis: Secondary | ICD-10-CM | POA: Diagnosis not present

## 2016-11-17 DIAGNOSIS — J3081 Allergic rhinitis due to animal (cat) (dog) hair and dander: Secondary | ICD-10-CM | POA: Diagnosis not present

## 2016-11-24 DIAGNOSIS — J3081 Allergic rhinitis due to animal (cat) (dog) hair and dander: Secondary | ICD-10-CM | POA: Diagnosis not present

## 2016-11-24 DIAGNOSIS — J3089 Other allergic rhinitis: Secondary | ICD-10-CM | POA: Diagnosis not present

## 2016-11-27 DIAGNOSIS — N398 Other specified disorders of urinary system: Secondary | ICD-10-CM | POA: Diagnosis not present

## 2016-11-30 DIAGNOSIS — M4727 Other spondylosis with radiculopathy, lumbosacral region: Secondary | ICD-10-CM | POA: Diagnosis not present

## 2016-11-30 DIAGNOSIS — M5116 Intervertebral disc disorders with radiculopathy, lumbar region: Secondary | ICD-10-CM | POA: Diagnosis not present

## 2016-11-30 DIAGNOSIS — M5416 Radiculopathy, lumbar region: Secondary | ICD-10-CM | POA: Diagnosis not present

## 2016-11-30 DIAGNOSIS — M4726 Other spondylosis with radiculopathy, lumbar region: Secondary | ICD-10-CM | POA: Diagnosis not present

## 2016-11-30 DIAGNOSIS — M5117 Intervertebral disc disorders with radiculopathy, lumbosacral region: Secondary | ICD-10-CM | POA: Diagnosis not present

## 2016-12-01 DIAGNOSIS — H269 Unspecified cataract: Secondary | ICD-10-CM | POA: Diagnosis not present

## 2016-12-08 DIAGNOSIS — J3081 Allergic rhinitis due to animal (cat) (dog) hair and dander: Secondary | ICD-10-CM | POA: Diagnosis not present

## 2016-12-08 DIAGNOSIS — J3089 Other allergic rhinitis: Secondary | ICD-10-CM | POA: Diagnosis not present

## 2016-12-15 DIAGNOSIS — J3081 Allergic rhinitis due to animal (cat) (dog) hair and dander: Secondary | ICD-10-CM | POA: Diagnosis not present

## 2016-12-15 DIAGNOSIS — J3089 Other allergic rhinitis: Secondary | ICD-10-CM | POA: Diagnosis not present

## 2016-12-16 DIAGNOSIS — J3081 Allergic rhinitis due to animal (cat) (dog) hair and dander: Secondary | ICD-10-CM | POA: Diagnosis not present

## 2016-12-16 DIAGNOSIS — J3089 Other allergic rhinitis: Secondary | ICD-10-CM | POA: Diagnosis not present

## 2016-12-18 DIAGNOSIS — N398 Other specified disorders of urinary system: Secondary | ICD-10-CM | POA: Diagnosis not present

## 2016-12-22 DIAGNOSIS — J3089 Other allergic rhinitis: Secondary | ICD-10-CM | POA: Diagnosis not present

## 2016-12-22 DIAGNOSIS — J301 Allergic rhinitis due to pollen: Secondary | ICD-10-CM | POA: Diagnosis not present

## 2017-01-01 DIAGNOSIS — R278 Other lack of coordination: Secondary | ICD-10-CM | POA: Diagnosis not present

## 2017-01-05 DIAGNOSIS — G894 Chronic pain syndrome: Secondary | ICD-10-CM | POA: Insufficient documentation

## 2017-01-05 DIAGNOSIS — M5136 Other intervertebral disc degeneration, lumbar region: Secondary | ICD-10-CM | POA: Diagnosis not present

## 2017-01-05 DIAGNOSIS — M47816 Spondylosis without myelopathy or radiculopathy, lumbar region: Secondary | ICD-10-CM | POA: Insufficient documentation

## 2017-01-05 DIAGNOSIS — M4696 Unspecified inflammatory spondylopathy, lumbar region: Secondary | ICD-10-CM | POA: Diagnosis not present

## 2017-01-05 DIAGNOSIS — M5416 Radiculopathy, lumbar region: Secondary | ICD-10-CM | POA: Diagnosis not present

## 2017-01-07 DIAGNOSIS — J3089 Other allergic rhinitis: Secondary | ICD-10-CM | POA: Diagnosis not present

## 2017-01-07 DIAGNOSIS — J3081 Allergic rhinitis due to animal (cat) (dog) hair and dander: Secondary | ICD-10-CM | POA: Diagnosis not present

## 2017-01-15 DIAGNOSIS — M5116 Intervertebral disc disorders with radiculopathy, lumbar region: Secondary | ICD-10-CM | POA: Diagnosis not present

## 2017-01-15 DIAGNOSIS — M1990 Unspecified osteoarthritis, unspecified site: Secondary | ICD-10-CM | POA: Diagnosis not present

## 2017-01-15 DIAGNOSIS — M5416 Radiculopathy, lumbar region: Secondary | ICD-10-CM | POA: Diagnosis not present

## 2017-01-21 DIAGNOSIS — J3081 Allergic rhinitis due to animal (cat) (dog) hair and dander: Secondary | ICD-10-CM | POA: Diagnosis not present

## 2017-01-21 DIAGNOSIS — J3089 Other allergic rhinitis: Secondary | ICD-10-CM | POA: Diagnosis not present

## 2017-01-22 DIAGNOSIS — R278 Other lack of coordination: Secondary | ICD-10-CM | POA: Diagnosis not present

## 2017-01-26 DIAGNOSIS — J3081 Allergic rhinitis due to animal (cat) (dog) hair and dander: Secondary | ICD-10-CM | POA: Diagnosis not present

## 2017-01-26 DIAGNOSIS — J3089 Other allergic rhinitis: Secondary | ICD-10-CM | POA: Diagnosis not present

## 2017-02-04 DIAGNOSIS — J3081 Allergic rhinitis due to animal (cat) (dog) hair and dander: Secondary | ICD-10-CM | POA: Diagnosis not present

## 2017-02-04 DIAGNOSIS — J3089 Other allergic rhinitis: Secondary | ICD-10-CM | POA: Diagnosis not present

## 2017-02-05 DIAGNOSIS — R278 Other lack of coordination: Secondary | ICD-10-CM | POA: Diagnosis not present

## 2017-02-09 DIAGNOSIS — J3089 Other allergic rhinitis: Secondary | ICD-10-CM | POA: Diagnosis not present

## 2017-02-09 DIAGNOSIS — J3081 Allergic rhinitis due to animal (cat) (dog) hair and dander: Secondary | ICD-10-CM | POA: Diagnosis not present

## 2017-02-16 DIAGNOSIS — J3089 Other allergic rhinitis: Secondary | ICD-10-CM | POA: Diagnosis not present

## 2017-02-16 DIAGNOSIS — J3081 Allergic rhinitis due to animal (cat) (dog) hair and dander: Secondary | ICD-10-CM | POA: Diagnosis not present

## 2017-02-17 DIAGNOSIS — M5416 Radiculopathy, lumbar region: Secondary | ICD-10-CM | POA: Diagnosis not present

## 2017-02-17 DIAGNOSIS — M5116 Intervertebral disc disorders with radiculopathy, lumbar region: Secondary | ICD-10-CM | POA: Diagnosis not present

## 2017-02-22 DIAGNOSIS — R278 Other lack of coordination: Secondary | ICD-10-CM | POA: Diagnosis not present

## 2017-02-23 DIAGNOSIS — J3089 Other allergic rhinitis: Secondary | ICD-10-CM | POA: Diagnosis not present

## 2017-02-23 DIAGNOSIS — J3081 Allergic rhinitis due to animal (cat) (dog) hair and dander: Secondary | ICD-10-CM | POA: Diagnosis not present

## 2017-03-02 DIAGNOSIS — J3089 Other allergic rhinitis: Secondary | ICD-10-CM | POA: Diagnosis not present

## 2017-03-02 DIAGNOSIS — J3081 Allergic rhinitis due to animal (cat) (dog) hair and dander: Secondary | ICD-10-CM | POA: Diagnosis not present

## 2017-03-08 DIAGNOSIS — R278 Other lack of coordination: Secondary | ICD-10-CM | POA: Diagnosis not present

## 2017-03-09 DIAGNOSIS — J3089 Other allergic rhinitis: Secondary | ICD-10-CM | POA: Diagnosis not present

## 2017-03-09 DIAGNOSIS — J3081 Allergic rhinitis due to animal (cat) (dog) hair and dander: Secondary | ICD-10-CM | POA: Diagnosis not present

## 2017-03-10 DIAGNOSIS — E8941 Symptomatic postprocedural ovarian failure: Secondary | ICD-10-CM | POA: Diagnosis not present

## 2017-03-10 DIAGNOSIS — Z01419 Encounter for gynecological examination (general) (routine) without abnormal findings: Secondary | ICD-10-CM | POA: Diagnosis not present

## 2017-03-10 DIAGNOSIS — N398 Other specified disorders of urinary system: Secondary | ICD-10-CM | POA: Diagnosis not present

## 2017-03-23 DIAGNOSIS — J301 Allergic rhinitis due to pollen: Secondary | ICD-10-CM | POA: Diagnosis not present

## 2017-03-23 DIAGNOSIS — J3089 Other allergic rhinitis: Secondary | ICD-10-CM | POA: Diagnosis not present

## 2017-03-30 DIAGNOSIS — J3089 Other allergic rhinitis: Secondary | ICD-10-CM | POA: Diagnosis not present

## 2017-03-30 DIAGNOSIS — J3081 Allergic rhinitis due to animal (cat) (dog) hair and dander: Secondary | ICD-10-CM | POA: Diagnosis not present

## 2017-04-06 DIAGNOSIS — J3089 Other allergic rhinitis: Secondary | ICD-10-CM | POA: Diagnosis not present

## 2017-04-06 DIAGNOSIS — J3081 Allergic rhinitis due to animal (cat) (dog) hair and dander: Secondary | ICD-10-CM | POA: Diagnosis not present

## 2017-04-07 DIAGNOSIS — N3281 Overactive bladder: Secondary | ICD-10-CM | POA: Diagnosis not present

## 2017-04-21 DIAGNOSIS — G894 Chronic pain syndrome: Secondary | ICD-10-CM | POA: Diagnosis not present

## 2017-04-21 DIAGNOSIS — M47816 Spondylosis without myelopathy or radiculopathy, lumbar region: Secondary | ICD-10-CM | POA: Diagnosis not present

## 2017-04-21 DIAGNOSIS — M5136 Other intervertebral disc degeneration, lumbar region: Secondary | ICD-10-CM | POA: Diagnosis not present

## 2017-04-29 DIAGNOSIS — J3089 Other allergic rhinitis: Secondary | ICD-10-CM | POA: Diagnosis not present

## 2017-04-29 DIAGNOSIS — J3081 Allergic rhinitis due to animal (cat) (dog) hair and dander: Secondary | ICD-10-CM | POA: Diagnosis not present

## 2017-05-04 DIAGNOSIS — J3089 Other allergic rhinitis: Secondary | ICD-10-CM | POA: Diagnosis not present

## 2017-05-04 DIAGNOSIS — J3081 Allergic rhinitis due to animal (cat) (dog) hair and dander: Secondary | ICD-10-CM | POA: Diagnosis not present

## 2017-05-18 DIAGNOSIS — J3081 Allergic rhinitis due to animal (cat) (dog) hair and dander: Secondary | ICD-10-CM | POA: Diagnosis not present

## 2017-05-18 DIAGNOSIS — J3089 Other allergic rhinitis: Secondary | ICD-10-CM | POA: Diagnosis not present

## 2017-06-08 DIAGNOSIS — M47817 Spondylosis without myelopathy or radiculopathy, lumbosacral region: Secondary | ICD-10-CM | POA: Diagnosis not present

## 2017-06-08 DIAGNOSIS — M47816 Spondylosis without myelopathy or radiculopathy, lumbar region: Secondary | ICD-10-CM | POA: Diagnosis not present

## 2017-06-08 DIAGNOSIS — J3089 Other allergic rhinitis: Secondary | ICD-10-CM | POA: Diagnosis not present

## 2017-06-08 DIAGNOSIS — J3081 Allergic rhinitis due to animal (cat) (dog) hair and dander: Secondary | ICD-10-CM | POA: Diagnosis not present

## 2017-06-14 DIAGNOSIS — Z1231 Encounter for screening mammogram for malignant neoplasm of breast: Secondary | ICD-10-CM | POA: Diagnosis not present

## 2017-06-15 DIAGNOSIS — J3081 Allergic rhinitis due to animal (cat) (dog) hair and dander: Secondary | ICD-10-CM | POA: Diagnosis not present

## 2017-06-15 DIAGNOSIS — J3089 Other allergic rhinitis: Secondary | ICD-10-CM | POA: Diagnosis not present

## 2017-06-22 DIAGNOSIS — J3081 Allergic rhinitis due to animal (cat) (dog) hair and dander: Secondary | ICD-10-CM | POA: Diagnosis not present

## 2017-06-22 DIAGNOSIS — J3089 Other allergic rhinitis: Secondary | ICD-10-CM | POA: Diagnosis not present

## 2017-06-30 DIAGNOSIS — J0191 Acute recurrent sinusitis, unspecified: Secondary | ICD-10-CM | POA: Diagnosis not present

## 2017-06-30 DIAGNOSIS — M48062 Spinal stenosis, lumbar region with neurogenic claudication: Secondary | ICD-10-CM | POA: Diagnosis not present

## 2017-06-30 DIAGNOSIS — I10 Essential (primary) hypertension: Secondary | ICD-10-CM | POA: Diagnosis not present

## 2017-06-30 DIAGNOSIS — F411 Generalized anxiety disorder: Secondary | ICD-10-CM | POA: Diagnosis not present

## 2017-07-08 DIAGNOSIS — J3089 Other allergic rhinitis: Secondary | ICD-10-CM | POA: Diagnosis not present

## 2017-07-08 DIAGNOSIS — J3081 Allergic rhinitis due to animal (cat) (dog) hair and dander: Secondary | ICD-10-CM | POA: Diagnosis not present

## 2017-07-22 DIAGNOSIS — J3089 Other allergic rhinitis: Secondary | ICD-10-CM | POA: Diagnosis not present

## 2017-07-22 DIAGNOSIS — J3081 Allergic rhinitis due to animal (cat) (dog) hair and dander: Secondary | ICD-10-CM | POA: Diagnosis not present

## 2017-07-27 DIAGNOSIS — J3081 Allergic rhinitis due to animal (cat) (dog) hair and dander: Secondary | ICD-10-CM | POA: Diagnosis not present

## 2017-07-27 DIAGNOSIS — J3089 Other allergic rhinitis: Secondary | ICD-10-CM | POA: Diagnosis not present

## 2017-08-05 DIAGNOSIS — J3081 Allergic rhinitis due to animal (cat) (dog) hair and dander: Secondary | ICD-10-CM | POA: Diagnosis not present

## 2017-08-05 DIAGNOSIS — J3089 Other allergic rhinitis: Secondary | ICD-10-CM | POA: Diagnosis not present

## 2017-08-10 DIAGNOSIS — J3081 Allergic rhinitis due to animal (cat) (dog) hair and dander: Secondary | ICD-10-CM | POA: Diagnosis not present

## 2017-08-10 DIAGNOSIS — J3089 Other allergic rhinitis: Secondary | ICD-10-CM | POA: Diagnosis not present

## 2017-08-17 DIAGNOSIS — J3089 Other allergic rhinitis: Secondary | ICD-10-CM | POA: Diagnosis not present

## 2017-08-17 DIAGNOSIS — J3081 Allergic rhinitis due to animal (cat) (dog) hair and dander: Secondary | ICD-10-CM | POA: Diagnosis not present

## 2017-08-24 DIAGNOSIS — M5416 Radiculopathy, lumbar region: Secondary | ICD-10-CM | POA: Diagnosis not present

## 2017-08-24 DIAGNOSIS — M5136 Other intervertebral disc degeneration, lumbar region: Secondary | ICD-10-CM | POA: Diagnosis not present

## 2017-08-24 DIAGNOSIS — M419 Scoliosis, unspecified: Secondary | ICD-10-CM | POA: Diagnosis not present

## 2017-08-24 DIAGNOSIS — M5116 Intervertebral disc disorders with radiculopathy, lumbar region: Secondary | ICD-10-CM | POA: Diagnosis not present

## 2017-08-24 DIAGNOSIS — M545 Low back pain: Secondary | ICD-10-CM | POA: Diagnosis not present

## 2017-08-24 DIAGNOSIS — G8929 Other chronic pain: Secondary | ICD-10-CM | POA: Diagnosis not present

## 2017-08-24 DIAGNOSIS — M5134 Other intervertebral disc degeneration, thoracic region: Secondary | ICD-10-CM | POA: Diagnosis not present

## 2017-08-26 DIAGNOSIS — J3089 Other allergic rhinitis: Secondary | ICD-10-CM | POA: Diagnosis not present

## 2017-08-26 DIAGNOSIS — J301 Allergic rhinitis due to pollen: Secondary | ICD-10-CM | POA: Diagnosis not present

## 2017-09-03 DIAGNOSIS — J0100 Acute maxillary sinusitis, unspecified: Secondary | ICD-10-CM | POA: Diagnosis not present

## 2017-09-07 DIAGNOSIS — J3081 Allergic rhinitis due to animal (cat) (dog) hair and dander: Secondary | ICD-10-CM | POA: Diagnosis not present

## 2017-09-07 DIAGNOSIS — J3089 Other allergic rhinitis: Secondary | ICD-10-CM | POA: Diagnosis not present

## 2017-09-13 DIAGNOSIS — M545 Low back pain: Secondary | ICD-10-CM | POA: Diagnosis not present

## 2017-09-13 DIAGNOSIS — M5416 Radiculopathy, lumbar region: Secondary | ICD-10-CM | POA: Diagnosis not present

## 2017-09-16 DIAGNOSIS — M5416 Radiculopathy, lumbar region: Secondary | ICD-10-CM | POA: Diagnosis not present

## 2017-09-16 DIAGNOSIS — M545 Low back pain: Secondary | ICD-10-CM | POA: Diagnosis not present

## 2017-09-21 DIAGNOSIS — J3081 Allergic rhinitis due to animal (cat) (dog) hair and dander: Secondary | ICD-10-CM | POA: Diagnosis not present

## 2017-09-21 DIAGNOSIS — M5416 Radiculopathy, lumbar region: Secondary | ICD-10-CM | POA: Diagnosis not present

## 2017-09-21 DIAGNOSIS — M545 Low back pain: Secondary | ICD-10-CM | POA: Diagnosis not present

## 2017-09-21 DIAGNOSIS — J3089 Other allergic rhinitis: Secondary | ICD-10-CM | POA: Diagnosis not present

## 2017-09-23 DIAGNOSIS — J3081 Allergic rhinitis due to animal (cat) (dog) hair and dander: Secondary | ICD-10-CM | POA: Diagnosis not present

## 2017-09-23 DIAGNOSIS — M5416 Radiculopathy, lumbar region: Secondary | ICD-10-CM | POA: Diagnosis not present

## 2017-09-23 DIAGNOSIS — M545 Low back pain: Secondary | ICD-10-CM | POA: Diagnosis not present

## 2017-09-23 DIAGNOSIS — J3089 Other allergic rhinitis: Secondary | ICD-10-CM | POA: Diagnosis not present

## 2017-09-27 DIAGNOSIS — M5416 Radiculopathy, lumbar region: Secondary | ICD-10-CM | POA: Diagnosis not present

## 2017-09-27 DIAGNOSIS — M545 Low back pain: Secondary | ICD-10-CM | POA: Diagnosis not present

## 2017-09-28 DIAGNOSIS — J3089 Other allergic rhinitis: Secondary | ICD-10-CM | POA: Diagnosis not present

## 2017-09-28 DIAGNOSIS — J3081 Allergic rhinitis due to animal (cat) (dog) hair and dander: Secondary | ICD-10-CM | POA: Diagnosis not present

## 2017-10-05 DIAGNOSIS — J3089 Other allergic rhinitis: Secondary | ICD-10-CM | POA: Diagnosis not present

## 2017-10-05 DIAGNOSIS — M5416 Radiculopathy, lumbar region: Secondary | ICD-10-CM | POA: Diagnosis not present

## 2017-10-05 DIAGNOSIS — J3081 Allergic rhinitis due to animal (cat) (dog) hair and dander: Secondary | ICD-10-CM | POA: Diagnosis not present

## 2017-10-05 DIAGNOSIS — M545 Low back pain: Secondary | ICD-10-CM | POA: Diagnosis not present

## 2017-10-07 DIAGNOSIS — M545 Low back pain: Secondary | ICD-10-CM | POA: Diagnosis not present

## 2017-10-07 DIAGNOSIS — M5416 Radiculopathy, lumbar region: Secondary | ICD-10-CM | POA: Diagnosis not present

## 2017-10-12 DIAGNOSIS — J3081 Allergic rhinitis due to animal (cat) (dog) hair and dander: Secondary | ICD-10-CM | POA: Diagnosis not present

## 2017-10-12 DIAGNOSIS — M5416 Radiculopathy, lumbar region: Secondary | ICD-10-CM | POA: Diagnosis not present

## 2017-10-12 DIAGNOSIS — J3089 Other allergic rhinitis: Secondary | ICD-10-CM | POA: Diagnosis not present

## 2017-10-12 DIAGNOSIS — M545 Low back pain: Secondary | ICD-10-CM | POA: Diagnosis not present

## 2017-10-14 DIAGNOSIS — M545 Low back pain: Secondary | ICD-10-CM | POA: Diagnosis not present

## 2017-10-14 DIAGNOSIS — M5416 Radiculopathy, lumbar region: Secondary | ICD-10-CM | POA: Diagnosis not present

## 2017-10-19 DIAGNOSIS — J3081 Allergic rhinitis due to animal (cat) (dog) hair and dander: Secondary | ICD-10-CM | POA: Diagnosis not present

## 2017-10-19 DIAGNOSIS — M545 Low back pain: Secondary | ICD-10-CM | POA: Diagnosis not present

## 2017-10-19 DIAGNOSIS — M5416 Radiculopathy, lumbar region: Secondary | ICD-10-CM | POA: Diagnosis not present

## 2017-10-19 DIAGNOSIS — J3089 Other allergic rhinitis: Secondary | ICD-10-CM | POA: Diagnosis not present

## 2017-10-21 DIAGNOSIS — M5416 Radiculopathy, lumbar region: Secondary | ICD-10-CM | POA: Diagnosis not present

## 2017-10-21 DIAGNOSIS — M545 Low back pain: Secondary | ICD-10-CM | POA: Diagnosis not present

## 2017-10-26 DIAGNOSIS — M4126 Other idiopathic scoliosis, lumbar region: Secondary | ICD-10-CM | POA: Diagnosis not present

## 2017-10-26 DIAGNOSIS — M5441 Lumbago with sciatica, right side: Secondary | ICD-10-CM | POA: Diagnosis not present

## 2017-10-26 DIAGNOSIS — M5137 Other intervertebral disc degeneration, lumbosacral region: Secondary | ICD-10-CM | POA: Diagnosis not present

## 2017-10-26 DIAGNOSIS — G8929 Other chronic pain: Secondary | ICD-10-CM | POA: Diagnosis not present

## 2017-10-26 DIAGNOSIS — Z8679 Personal history of other diseases of the circulatory system: Secondary | ICD-10-CM | POA: Diagnosis not present

## 2017-11-02 DIAGNOSIS — J3089 Other allergic rhinitis: Secondary | ICD-10-CM | POA: Diagnosis not present

## 2017-11-02 DIAGNOSIS — J3081 Allergic rhinitis due to animal (cat) (dog) hair and dander: Secondary | ICD-10-CM | POA: Diagnosis not present

## 2017-11-09 DIAGNOSIS — M5416 Radiculopathy, lumbar region: Secondary | ICD-10-CM | POA: Diagnosis not present

## 2017-11-09 DIAGNOSIS — M545 Low back pain: Secondary | ICD-10-CM | POA: Diagnosis not present

## 2017-11-09 DIAGNOSIS — J3081 Allergic rhinitis due to animal (cat) (dog) hair and dander: Secondary | ICD-10-CM | POA: Diagnosis not present

## 2017-11-09 DIAGNOSIS — J3089 Other allergic rhinitis: Secondary | ICD-10-CM | POA: Diagnosis not present

## 2017-11-11 DIAGNOSIS — M5416 Radiculopathy, lumbar region: Secondary | ICD-10-CM | POA: Diagnosis not present

## 2017-11-11 DIAGNOSIS — M545 Low back pain: Secondary | ICD-10-CM | POA: Diagnosis not present

## 2017-11-15 DIAGNOSIS — M545 Low back pain: Secondary | ICD-10-CM | POA: Diagnosis not present

## 2017-11-15 DIAGNOSIS — M5416 Radiculopathy, lumbar region: Secondary | ICD-10-CM | POA: Diagnosis not present

## 2017-11-18 DIAGNOSIS — J3089 Other allergic rhinitis: Secondary | ICD-10-CM | POA: Diagnosis not present

## 2017-11-18 DIAGNOSIS — J3081 Allergic rhinitis due to animal (cat) (dog) hair and dander: Secondary | ICD-10-CM | POA: Diagnosis not present

## 2017-11-22 DIAGNOSIS — M5416 Radiculopathy, lumbar region: Secondary | ICD-10-CM | POA: Diagnosis not present

## 2017-11-22 DIAGNOSIS — M545 Low back pain: Secondary | ICD-10-CM | POA: Diagnosis not present

## 2017-11-23 DIAGNOSIS — J3089 Other allergic rhinitis: Secondary | ICD-10-CM | POA: Diagnosis not present

## 2017-11-23 DIAGNOSIS — J3081 Allergic rhinitis due to animal (cat) (dog) hair and dander: Secondary | ICD-10-CM | POA: Diagnosis not present

## 2017-11-25 DIAGNOSIS — M545 Low back pain: Secondary | ICD-10-CM | POA: Diagnosis not present

## 2017-11-25 DIAGNOSIS — M5416 Radiculopathy, lumbar region: Secondary | ICD-10-CM | POA: Diagnosis not present

## 2017-11-29 DIAGNOSIS — M5416 Radiculopathy, lumbar region: Secondary | ICD-10-CM | POA: Diagnosis not present

## 2017-11-29 DIAGNOSIS — M545 Low back pain: Secondary | ICD-10-CM | POA: Diagnosis not present

## 2017-12-02 DIAGNOSIS — M545 Low back pain: Secondary | ICD-10-CM | POA: Diagnosis not present

## 2017-12-02 DIAGNOSIS — M5416 Radiculopathy, lumbar region: Secondary | ICD-10-CM | POA: Diagnosis not present

## 2017-12-06 DIAGNOSIS — M5416 Radiculopathy, lumbar region: Secondary | ICD-10-CM | POA: Diagnosis not present

## 2017-12-06 DIAGNOSIS — M545 Low back pain: Secondary | ICD-10-CM | POA: Diagnosis not present

## 2017-12-09 DIAGNOSIS — J3081 Allergic rhinitis due to animal (cat) (dog) hair and dander: Secondary | ICD-10-CM | POA: Diagnosis not present

## 2017-12-09 DIAGNOSIS — J3089 Other allergic rhinitis: Secondary | ICD-10-CM | POA: Diagnosis not present

## 2017-12-13 DIAGNOSIS — M545 Low back pain: Secondary | ICD-10-CM | POA: Diagnosis not present

## 2017-12-13 DIAGNOSIS — M5416 Radiculopathy, lumbar region: Secondary | ICD-10-CM | POA: Diagnosis not present

## 2017-12-21 DIAGNOSIS — J3089 Other allergic rhinitis: Secondary | ICD-10-CM | POA: Diagnosis not present

## 2017-12-21 DIAGNOSIS — J4530 Mild persistent asthma, uncomplicated: Secondary | ICD-10-CM | POA: Diagnosis not present

## 2017-12-21 DIAGNOSIS — J3081 Allergic rhinitis due to animal (cat) (dog) hair and dander: Secondary | ICD-10-CM | POA: Diagnosis not present

## 2017-12-21 DIAGNOSIS — H1045 Other chronic allergic conjunctivitis: Secondary | ICD-10-CM | POA: Diagnosis not present

## 2017-12-28 DIAGNOSIS — J3081 Allergic rhinitis due to animal (cat) (dog) hair and dander: Secondary | ICD-10-CM | POA: Diagnosis not present

## 2017-12-28 DIAGNOSIS — J3089 Other allergic rhinitis: Secondary | ICD-10-CM | POA: Diagnosis not present

## 2018-01-04 DIAGNOSIS — J301 Allergic rhinitis due to pollen: Secondary | ICD-10-CM | POA: Diagnosis not present

## 2018-01-04 DIAGNOSIS — J3089 Other allergic rhinitis: Secondary | ICD-10-CM | POA: Diagnosis not present

## 2018-01-04 DIAGNOSIS — J3081 Allergic rhinitis due to animal (cat) (dog) hair and dander: Secondary | ICD-10-CM | POA: Diagnosis not present

## 2018-01-07 DIAGNOSIS — D1800 Hemangioma unspecified site: Secondary | ICD-10-CM | POA: Diagnosis not present

## 2018-01-07 DIAGNOSIS — Z1283 Encounter for screening for malignant neoplasm of skin: Secondary | ICD-10-CM | POA: Diagnosis not present

## 2018-01-07 DIAGNOSIS — R21 Rash and other nonspecific skin eruption: Secondary | ICD-10-CM | POA: Diagnosis not present

## 2018-01-07 DIAGNOSIS — D229 Melanocytic nevi, unspecified: Secondary | ICD-10-CM | POA: Diagnosis not present

## 2018-01-07 DIAGNOSIS — D485 Neoplasm of uncertain behavior of skin: Secondary | ICD-10-CM | POA: Diagnosis not present

## 2018-01-07 DIAGNOSIS — L570 Actinic keratosis: Secondary | ICD-10-CM | POA: Diagnosis not present

## 2018-01-07 DIAGNOSIS — L812 Freckles: Secondary | ICD-10-CM | POA: Diagnosis not present

## 2018-01-07 DIAGNOSIS — D2362 Other benign neoplasm of skin of left upper limb, including shoulder: Secondary | ICD-10-CM | POA: Diagnosis not present

## 2018-01-07 DIAGNOSIS — L821 Other seborrheic keratosis: Secondary | ICD-10-CM | POA: Diagnosis not present

## 2018-01-07 DIAGNOSIS — L700 Acne vulgaris: Secondary | ICD-10-CM | POA: Diagnosis not present

## 2018-01-13 DIAGNOSIS — J3089 Other allergic rhinitis: Secondary | ICD-10-CM | POA: Diagnosis not present

## 2018-01-13 DIAGNOSIS — J3081 Allergic rhinitis due to animal (cat) (dog) hair and dander: Secondary | ICD-10-CM | POA: Diagnosis not present

## 2018-01-19 DIAGNOSIS — I10 Essential (primary) hypertension: Secondary | ICD-10-CM | POA: Diagnosis not present

## 2018-01-19 DIAGNOSIS — Z0000 Encounter for general adult medical examination without abnormal findings: Secondary | ICD-10-CM | POA: Diagnosis not present

## 2018-01-19 DIAGNOSIS — F411 Generalized anxiety disorder: Secondary | ICD-10-CM | POA: Diagnosis not present

## 2018-01-19 DIAGNOSIS — J309 Allergic rhinitis, unspecified: Secondary | ICD-10-CM | POA: Diagnosis not present

## 2018-01-19 DIAGNOSIS — E785 Hyperlipidemia, unspecified: Secondary | ICD-10-CM | POA: Diagnosis not present

## 2018-01-19 DIAGNOSIS — M48062 Spinal stenosis, lumbar region with neurogenic claudication: Secondary | ICD-10-CM | POA: Diagnosis not present

## 2018-01-19 DIAGNOSIS — K589 Irritable bowel syndrome without diarrhea: Secondary | ICD-10-CM | POA: Diagnosis not present

## 2018-01-26 DIAGNOSIS — E785 Hyperlipidemia, unspecified: Secondary | ICD-10-CM | POA: Diagnosis not present

## 2018-01-26 DIAGNOSIS — I10 Essential (primary) hypertension: Secondary | ICD-10-CM | POA: Diagnosis not present

## 2018-01-31 DIAGNOSIS — E785 Hyperlipidemia, unspecified: Secondary | ICD-10-CM | POA: Diagnosis not present

## 2018-01-31 DIAGNOSIS — Z01818 Encounter for other preprocedural examination: Secondary | ICD-10-CM | POA: Diagnosis not present

## 2018-01-31 DIAGNOSIS — M48062 Spinal stenosis, lumbar region with neurogenic claudication: Secondary | ICD-10-CM | POA: Diagnosis not present

## 2018-01-31 DIAGNOSIS — I10 Essential (primary) hypertension: Secondary | ICD-10-CM | POA: Diagnosis not present

## 2018-01-31 DIAGNOSIS — I4581 Long QT syndrome: Secondary | ICD-10-CM | POA: Diagnosis not present

## 2018-01-31 DIAGNOSIS — Z8679 Personal history of other diseases of the circulatory system: Secondary | ICD-10-CM | POA: Diagnosis not present

## 2018-02-03 DIAGNOSIS — J301 Allergic rhinitis due to pollen: Secondary | ICD-10-CM | POA: Diagnosis not present

## 2018-02-03 DIAGNOSIS — J3081 Allergic rhinitis due to animal (cat) (dog) hair and dander: Secondary | ICD-10-CM | POA: Diagnosis not present

## 2018-02-08 DIAGNOSIS — J3089 Other allergic rhinitis: Secondary | ICD-10-CM | POA: Diagnosis not present

## 2018-02-10 DIAGNOSIS — J3089 Other allergic rhinitis: Secondary | ICD-10-CM | POA: Diagnosis not present

## 2018-02-10 DIAGNOSIS — J3081 Allergic rhinitis due to animal (cat) (dog) hair and dander: Secondary | ICD-10-CM | POA: Diagnosis not present

## 2018-02-15 DIAGNOSIS — J3089 Other allergic rhinitis: Secondary | ICD-10-CM | POA: Diagnosis not present

## 2018-02-15 DIAGNOSIS — J3081 Allergic rhinitis due to animal (cat) (dog) hair and dander: Secondary | ICD-10-CM | POA: Diagnosis not present

## 2018-02-22 DIAGNOSIS — J3081 Allergic rhinitis due to animal (cat) (dog) hair and dander: Secondary | ICD-10-CM | POA: Diagnosis not present

## 2018-02-22 DIAGNOSIS — J3089 Other allergic rhinitis: Secondary | ICD-10-CM | POA: Diagnosis not present

## 2018-02-24 DIAGNOSIS — L905 Scar conditions and fibrosis of skin: Secondary | ICD-10-CM | POA: Diagnosis not present

## 2018-02-24 DIAGNOSIS — L739 Follicular disorder, unspecified: Secondary | ICD-10-CM | POA: Diagnosis not present

## 2018-02-24 DIAGNOSIS — D485 Neoplasm of uncertain behavior of skin: Secondary | ICD-10-CM | POA: Diagnosis not present

## 2018-02-24 DIAGNOSIS — L820 Inflamed seborrheic keratosis: Secondary | ICD-10-CM | POA: Diagnosis not present

## 2018-03-02 DIAGNOSIS — E785 Hyperlipidemia, unspecified: Secondary | ICD-10-CM | POA: Diagnosis not present

## 2018-03-02 DIAGNOSIS — R945 Abnormal results of liver function studies: Secondary | ICD-10-CM | POA: Diagnosis not present

## 2018-03-02 DIAGNOSIS — G43909 Migraine, unspecified, not intractable, without status migrainosus: Secondary | ICD-10-CM | POA: Diagnosis not present

## 2018-03-02 DIAGNOSIS — M545 Low back pain: Secondary | ICD-10-CM

## 2018-03-02 DIAGNOSIS — M48062 Spinal stenosis, lumbar region with neurogenic claudication: Secondary | ICD-10-CM | POA: Diagnosis not present

## 2018-03-02 DIAGNOSIS — E876 Hypokalemia: Secondary | ICD-10-CM | POA: Diagnosis not present

## 2018-03-02 DIAGNOSIS — E875 Hyperkalemia: Secondary | ICD-10-CM | POA: Diagnosis not present

## 2018-03-02 DIAGNOSIS — G960 Cerebrospinal fluid leak: Secondary | ICD-10-CM | POA: Diagnosis not present

## 2018-03-02 DIAGNOSIS — M4155 Other secondary scoliosis, thoracolumbar region: Secondary | ICD-10-CM | POA: Diagnosis not present

## 2018-03-02 DIAGNOSIS — G8918 Other acute postprocedural pain: Secondary | ICD-10-CM | POA: Diagnosis not present

## 2018-03-02 DIAGNOSIS — M4156 Other secondary scoliosis, lumbar region: Secondary | ICD-10-CM | POA: Diagnosis not present

## 2018-03-02 DIAGNOSIS — D62 Acute posthemorrhagic anemia: Secondary | ICD-10-CM | POA: Diagnosis not present

## 2018-03-02 DIAGNOSIS — Z9049 Acquired absence of other specified parts of digestive tract: Secondary | ICD-10-CM | POA: Diagnosis not present

## 2018-03-02 DIAGNOSIS — J45909 Unspecified asthma, uncomplicated: Secondary | ICD-10-CM | POA: Diagnosis not present

## 2018-03-02 DIAGNOSIS — F411 Generalized anxiety disorder: Secondary | ICD-10-CM | POA: Diagnosis not present

## 2018-03-02 DIAGNOSIS — K5909 Other constipation: Secondary | ICD-10-CM | POA: Diagnosis not present

## 2018-03-02 DIAGNOSIS — M1990 Unspecified osteoarthritis, unspecified site: Secondary | ICD-10-CM | POA: Insufficient documentation

## 2018-03-02 DIAGNOSIS — G9741 Accidental puncture or laceration of dura during a procedure: Secondary | ICD-10-CM | POA: Diagnosis not present

## 2018-03-02 DIAGNOSIS — F419 Anxiety disorder, unspecified: Secondary | ICD-10-CM | POA: Diagnosis not present

## 2018-03-02 DIAGNOSIS — I10 Essential (primary) hypertension: Secondary | ICD-10-CM | POA: Diagnosis not present

## 2018-03-02 DIAGNOSIS — K219 Gastro-esophageal reflux disease without esophagitis: Secondary | ICD-10-CM | POA: Diagnosis not present

## 2018-03-02 DIAGNOSIS — F418 Other specified anxiety disorders: Secondary | ICD-10-CM | POA: Diagnosis not present

## 2018-03-02 DIAGNOSIS — Z90710 Acquired absence of both cervix and uterus: Secondary | ICD-10-CM | POA: Diagnosis not present

## 2018-03-02 DIAGNOSIS — M4805 Spinal stenosis, thoracolumbar region: Secondary | ICD-10-CM | POA: Diagnosis not present

## 2018-03-02 DIAGNOSIS — G8928 Other chronic postprocedural pain: Secondary | ICD-10-CM | POA: Diagnosis not present

## 2018-03-02 DIAGNOSIS — M960 Pseudarthrosis after fusion or arthrodesis: Secondary | ICD-10-CM | POA: Diagnosis not present

## 2018-03-02 DIAGNOSIS — Z981 Arthrodesis status: Secondary | ICD-10-CM | POA: Diagnosis not present

## 2018-03-02 DIAGNOSIS — M48061 Spinal stenosis, lumbar region without neurogenic claudication: Secondary | ICD-10-CM | POA: Diagnosis not present

## 2018-03-02 DIAGNOSIS — F329 Major depressive disorder, single episode, unspecified: Secondary | ICD-10-CM | POA: Diagnosis not present

## 2018-03-02 DIAGNOSIS — M4804 Spinal stenosis, thoracic region: Secondary | ICD-10-CM | POA: Diagnosis not present

## 2018-03-02 DIAGNOSIS — K589 Irritable bowel syndrome without diarrhea: Secondary | ICD-10-CM | POA: Diagnosis not present

## 2018-03-02 DIAGNOSIS — M4150 Other secondary scoliosis, site unspecified: Secondary | ICD-10-CM | POA: Diagnosis not present

## 2018-03-02 DIAGNOSIS — I9581 Postprocedural hypotension: Secondary | ICD-10-CM | POA: Diagnosis not present

## 2018-03-02 DIAGNOSIS — R918 Other nonspecific abnormal finding of lung field: Secondary | ICD-10-CM | POA: Diagnosis not present

## 2018-03-02 DIAGNOSIS — G8929 Other chronic pain: Secondary | ICD-10-CM | POA: Diagnosis not present

## 2018-03-02 DIAGNOSIS — M5416 Radiculopathy, lumbar region: Secondary | ICD-10-CM | POA: Diagnosis not present

## 2018-03-02 HISTORY — PX: LUMBAR LAMINECTOMY: SHX95

## 2018-03-09 DIAGNOSIS — Z4789 Encounter for other orthopedic aftercare: Secondary | ICD-10-CM | POA: Diagnosis not present

## 2018-03-09 DIAGNOSIS — G5600 Carpal tunnel syndrome, unspecified upper limb: Secondary | ICD-10-CM | POA: Diagnosis not present

## 2018-03-09 DIAGNOSIS — D649 Anemia, unspecified: Secondary | ICD-10-CM | POA: Diagnosis not present

## 2018-03-09 DIAGNOSIS — M48061 Spinal stenosis, lumbar region without neurogenic claudication: Secondary | ICD-10-CM | POA: Diagnosis not present

## 2018-03-09 DIAGNOSIS — M545 Low back pain: Secondary | ICD-10-CM | POA: Diagnosis not present

## 2018-03-09 DIAGNOSIS — G8929 Other chronic pain: Secondary | ICD-10-CM | POA: Diagnosis not present

## 2018-03-09 DIAGNOSIS — M1990 Unspecified osteoarthritis, unspecified site: Secondary | ICD-10-CM | POA: Diagnosis not present

## 2018-03-09 DIAGNOSIS — F419 Anxiety disorder, unspecified: Secondary | ICD-10-CM | POA: Diagnosis not present

## 2018-03-09 DIAGNOSIS — F329 Major depressive disorder, single episode, unspecified: Secondary | ICD-10-CM | POA: Diagnosis not present

## 2018-03-09 DIAGNOSIS — J45909 Unspecified asthma, uncomplicated: Secondary | ICD-10-CM | POA: Diagnosis not present

## 2018-03-09 DIAGNOSIS — I10 Essential (primary) hypertension: Secondary | ICD-10-CM | POA: Diagnosis not present

## 2018-03-09 DIAGNOSIS — K589 Irritable bowel syndrome without diarrhea: Secondary | ICD-10-CM | POA: Diagnosis not present

## 2018-03-09 DIAGNOSIS — G43909 Migraine, unspecified, not intractable, without status migrainosus: Secondary | ICD-10-CM | POA: Diagnosis not present

## 2018-03-14 ENCOUNTER — Other Ambulatory Visit: Payer: Self-pay | Admitting: *Deleted

## 2018-03-14 DIAGNOSIS — K589 Irritable bowel syndrome without diarrhea: Secondary | ICD-10-CM | POA: Insufficient documentation

## 2018-03-14 DIAGNOSIS — G8929 Other chronic pain: Secondary | ICD-10-CM | POA: Insufficient documentation

## 2018-03-14 DIAGNOSIS — F32A Depression, unspecified: Secondary | ICD-10-CM | POA: Insufficient documentation

## 2018-03-14 DIAGNOSIS — M79602 Pain in left arm: Secondary | ICD-10-CM

## 2018-03-14 DIAGNOSIS — F411 Generalized anxiety disorder: Secondary | ICD-10-CM | POA: Insufficient documentation

## 2018-03-14 DIAGNOSIS — J309 Allergic rhinitis, unspecified: Secondary | ICD-10-CM | POA: Insufficient documentation

## 2018-03-14 DIAGNOSIS — I10 Essential (primary) hypertension: Secondary | ICD-10-CM | POA: Insufficient documentation

## 2018-03-14 DIAGNOSIS — K219 Gastro-esophageal reflux disease without esophagitis: Secondary | ICD-10-CM | POA: Insufficient documentation

## 2018-03-14 DIAGNOSIS — F329 Major depressive disorder, single episode, unspecified: Secondary | ICD-10-CM | POA: Insufficient documentation

## 2018-03-14 DIAGNOSIS — J45909 Unspecified asthma, uncomplicated: Secondary | ICD-10-CM | POA: Insufficient documentation

## 2018-03-14 NOTE — Patient Outreach (Addendum)
Screened for Transitiion of care calls. Pt had lumbar laminectomy on 8/14 @ [facility]. She was discharged on 8/19 to her son's and daughter-in-laws home since her husband works nights and they did not want her to be alone. I was referred the pt for an initial evaluation on 03/10/18.   This pt needs complex community care managment. Her main problems are post surgical safety and mobility, pain control, constipation, depression, anxiety.  She could benefit from counseling which she is not receiving at this time but would like to wait to agree to one of our [place] until after her home visit with our [facility]. I have encouraged this. She has had a lot of loss in the last 3 years. Both parents died in 29-Oct-2015, her father had multiple strokes and her mother had Alzheimer's disease. She is disabled and cannot do much of the things she used to enjoy. She has a 41 year old grandson with Autism that she has cared for during the day since he was 29 months old. He is starting school this year and she watches another grandchild after school. This is what keeps her going.  I have completed the initial screening, reviewed her meds and problem list and updated them in the Epic system.  Patient was recently discharged from hospital and all medications have been reviewed.  Current Meds  Medication Sig  . albuterol (PROVENTIL HFA;VENTOLIN HFA) 108 (90 BASE) MCG/ACT inhaler Inhale 2 puffs into the lungs as needed.    . ALPRAZolam (XANAX) 0.25 MG tablet Take 0.25 mg by mouth every 8 (eight) hours as needed.    Marland Kitchen amLODipine (NORVASC) 5 MG tablet Take 5 mg by mouth daily.    . BUPROPION HCL, SR, PO Take 150 mg by mouth daily.    Mariane Baumgarten Calcium (STOOL SOFTENER PO) Take by mouth as needed.    Marland Kitchen estradiol (CLIMARA - DOSED IN MG/24 HR) 0.025 mg/24hr patch Place 0.025 mg onto the skin once a week.  . fluticasone (FLONASE) 50 MCG/ACT nasal spray 1 spray by Nasal route daily.    Marland Kitchen gabapentin (NEURONTIN) 600 MG tablet  Take 600 mg by mouth 4 (four) times daily.    . hyoscyamine (LEVSIN, ANASPAZ) 0.125 MG tablet Take 0.125 mg by mouth as needed.    Marland Kitchen levocetirizine (XYZAL) 5 MG tablet Take 5 mg by mouth at bedtime.    . methocarbamol (ROBAXIN) 500 MG tablet Take 500 mg by mouth 3 (three) times daily.  . montelukast (SINGULAIR) 10 MG tablet Take 10 mg by mouth at bedtime.    Marland Kitchen omeprazole (PRILOSEC) 20 MG capsule Take 20 mg by mouth 2 (two) times daily.    Marland Kitchen oxyCODONE (OXY IR/ROXICODONE) 5 MG immediate release tablet Take 5 mg by mouth every 4 (four) hours as needed for severe pain (1-3 tablets every 4 hours as needed.).  Marland Kitchen polyethylene glycol (MIRALAX / GLYCOLAX) packet Take 17 g by mouth daily.  Marland Kitchen triamterene-hydrochlorothiazide (MAXZIDE-25) 37.5-25 MG per tablet Take 1 tablet by mouth daily.    Marland Kitchen venlafaxine (EFFEXOR) 75 MG tablet Take 150 mg by mouth 2 (two) times daily.    . [DISCONTINUED] OxyCODONE HCl, Abuse Deter, (OXAYDO) 5 MG TABA Take 5 mg by mouth 6 (six) times daily.   Depression screen [place] 2/9 03/14/2018  Decreased Interest 3  Down, Depressed, Hopeless 3  PHQ - 2 Score 6  Altered sleeping 3  Tired, decreased energy 3  Change in appetite 3  Feeling bad or failure about  yourself  3  Trouble concentrating 3  Moving slowly or fidgety/restless 0  Suicidal thoughts 0  PHQ-9 Score 21  Difficult doing work/chores Somewhat difficult     She will see her surgeon on 9/919. She does not have a primary care office visit until October.  I have encouraged her to get a flu shot this year. She did not get one the last couple of years.  I am referring her to the [facility] in the [place] area for follow up.  I have suggested she start taking Miralax bid as needed and she may want to drink some magnesium citrate now since she has not had a BM in a week.  Eulah Pont. Myrtie Neither, MSN, [facility] Gerontological Nurse Practitioner [facility] Care Management [phone]

## 2018-03-23 ENCOUNTER — Other Ambulatory Visit: Payer: Self-pay

## 2018-03-23 NOTE — Patient Outreach (Signed)
[facility]) Care Management  03/23/2018  Maureen Wiggins [birth year] [id number]   Care Coordination: Successful telephone encounter to the above patient. Referral received from Maureen Wiggins 03/14/18 for TOC after NP completed initial TOC assessment. [facility] RN CM introduced role of [facility] Community Case Management. Verbal Consent again given by patient. Initial home visit scheduled for tomorrow, 03/24/18 at 9:30 for week 2 TOC assessment and completion of initial home visit.  Patient continues to reside with son and daughter in law at [address] [name], however she hopes to return to her home in [name] next week after her follow-up visit with her surgeon post lumbar laminectomy.  Plan: Home visit as scheduled tomorrow.  Maureen Lovejoy E. Rollene Rotunda RN, BSN [facility] Care Management Coordinator [phone]

## 2018-03-24 ENCOUNTER — Other Ambulatory Visit: Payer: Self-pay

## 2018-03-24 NOTE — Patient Outreach (Signed)
[facility]) Care Management   03/24/2018  Maureen Wiggins 1956-06-16 [id number]  Maureen Wiggins is an 62 y.o. female  Subjective: Patient states she is doing OK. Patient states she is recovering well from her recent surgery. "I will be glad when these stables come out on Monday so I can sleep better at night". Patient describes the stables as "itching and pulling". She states she continues to battle with depression. She admits to years of unhappiness from her divorce 20 years ago, her current marriage, and the loss of her parents. "I haven't seen a counselor in a while". "I don't want a referral right now because I want to concentrate on my physical recovery. That will help my emotional recovery". Patient states she does not have any needs for additional [facility] community services such as CSW or pharmacy. Patient is currently staying with her son and daughter-in-law. Patient states "my husband is not an attentive caregiver". Patient attempted to previously return to her home however she was unable to care for herself and states her husband would not help with ADLs. Patient hopes to return to her home after Monday's surgical follow-up appointment and staple removal but daughter-in-law states is not optimistic patient will have the help she needs.   Patient states she is now having more frequent BMs and constipation is not "as bad". She states she took Mag Citrate as recommended with good results and taking a daily stool softner and intermittent miralax..   Patient states if she can just focus on recovery from her lumbar laminectomy surgery, she can focus on her more chronic issues such as HTN, depression, weight loss. She does not want to fall.    Objective:   BP 116/74 (BP Location: Right Arm, Patient Position: Sitting, Cuff Size: Normal)   Pulse 88   Resp 18   Ht 1.626 m (5' 4.02")   Wt 192 lb (87.1 kg) Comment: per discharge summary  SpO2 97%   BMI 32.94 kg/m   Physical Exam   Constitutional: She is oriented to person, place, and time. She appears well-developed and well-nourished.  Neck: Normal range of motion.  Cardiovascular: Normal rate, regular rhythm, normal heart sounds and intact distal pulses.  Respiratory: Effort normal and breath sounds normal.  GI: Soft. Bowel sounds are normal.  Mild abdominal distension. Patient passing gas  Musculoskeletal:  Limited ROM at waist  Neurological: She is alert and oriented to person, place, and time.  Skin: Skin is warm and dry.  Mid to lower lumbar spinal incision D/I with stables. Incision approximated. No drainage noted. Mild erythema noted to staple insertion sites.  Psychiatric: She has a normal mood and affect. Her behavior is normal. Judgment and thought content normal.  Patient intermittently tearful    Encounter Medications:   Outpatient Encounter Medications as of 03/24/2018  Medication Sig Note  . ALPRAZolam (XANAX) 0.25 MG tablet Take 0.25 mg by mouth every 8 (eight) hours as needed.     Marland Kitchen amLODipine (NORVASC) 5 MG tablet Take 5 mg by mouth daily.     . BUPROPION HCL, SR, PO Take 150 mg by mouth daily.     Mariane Baumgarten Calcium (STOOL SOFTENER PO) Take by mouth as needed.     Marland Kitchen estradiol (CLIMARA - DOSED IN MG/24 HR) 0.025 mg/24hr patch Place 0.025 mg onto the skin once a week.   . fluticasone (FLONASE) 50 MCG/ACT nasal spray 1 spray by Nasal route daily.     Marland Kitchen gabapentin (NEURONTIN) 600  MG tablet Take 600 mg by mouth 4 (four) times daily.     Marland Kitchen LINZESS 145 MCG CAPS capsule Take 145 mcg by mouth daily.   . methocarbamol (ROBAXIN) 500 MG tablet Take 500 mg by mouth 3 (three) times daily.   . montelukast (SINGULAIR) 10 MG tablet Take 10 mg by mouth at bedtime.     Marland Kitchen omeprazole (PRILOSEC) 20 MG capsule Take 20 mg by mouth 2 (two) times daily.     Marland Kitchen oxyCODONE (OXY IR/ROXICODONE) 5 MG immediate release tablet Take 5 mg by mouth every 4 (four) hours as needed for severe pain (1-3 tablets every 4 hours as  needed.). 03/14/2018: Pt is weaning her self down to 1 tablet every 4 hours.  . polyethylene glycol (MIRALAX / GLYCOLAX) packet Take 17 g by mouth daily.   Marland Kitchen tiZANidine (ZANAFLEX) 4 MG tablet Take 4 mg by mouth daily as needed.   . triamterene-hydrochlorothiazide (MAXZIDE-25) 37.5-25 MG per tablet Take 1 tablet by mouth daily.     Marland Kitchen venlafaxine (EFFEXOR) 75 MG tablet Take 150 mg by mouth 2 (two) times daily.     Marland Kitchen albuterol (PROVENTIL HFA;VENTOLIN HFA) 108 (90 BASE) MCG/ACT inhaler Inhale 2 puffs into the lungs as needed.   03/24/2018: Has available if needed  . celecoxib (CELEBREX) 200 MG capsule Take 200 mg by mouth 2 (two) times daily.   Marland Kitchen EPINEPHrine (EPI-PEN) 0.3 mg/0.3 mL DEVI Inject 0.3 mg into the muscle once. For emergency allergic reaction.  03/14/2018: Has this but has never had to use it.  Marland Kitchen HYDROcodone-acetaminophen (VICODIN) 5-500 MG per tablet Take 1 tablet by mouth every 8 (eight) hours as needed.   03/14/2018: This was primarily prescribed for L shoulder nerve impingement pain.  . hyoscyamine (LEVSIN, ANASPAZ) 0.125 MG tablet Take 0.125 mg by mouth as needed.     Marland Kitchen levocetirizine (XYZAL) 5 MG tablet Take 5 mg by mouth at bedtime.   03/14/2018: Only prn.  . simvastatin (ZOCOR) 20 MG tablet Take 20 mg by mouth daily.      No facility-administered encounter medications on file as of 03/24/2018.     Functional Status:   In your present state of health, do you have any difficulty performing the following activities: 03/24/2018  Hearing? N  Vision? N  Difficulty concentrating or making decisions? N  Walking or climbing stairs? Y  Comment related to recent back surgery  Dressing or bathing? N  Doing errands, shopping? Y  Comment only related to recent back surgery. otherwise no  Conservation officer, nature and eating ? N  Using the Toilet? N  In the past six months, have you accidently leaked urine? N  Do you have problems with loss of bowel control? N  Managing your Medications? N  Managing your  Finances? N  Housekeeping or managing your Housekeeping? N  Some recent data might be hidden    Fall/Depression Screening:    Fall Risk  03/24/2018  Falls in the past year? Yes  Number falls in past yr: 1  Injury with Fall? No  Risk for fall due to : Impaired mobility;Medication side effect  Risk for fall due to: Comment recent back surgery   PHQ 2/9 Scores 03/14/2018  PHQ - 2 Score 6  PHQ- 9 Score 21    Assessment:  Patient is pleasant and well groomed. Remained engaged during visit. Daughter-in-law present during home visit. Patient becomes very tearful when discussing family history and marital status. Able to stand and ambulate with walker independently.  Slow steady gait. Grimaces with pain. Discharge summary present. Medications reconciled and in home. RN CM and patient will focus on Fall prevention related to fall risk increase from recent lumbar surgery, as patient wishes to focus on surgical healing prior to chronic conditions. Patient will let RN CM know if she wishes to pursue counseling for depression. Referral to SW will be made at that time  Plan: Follow up transition of care call in 1 week  [facility] CM Care Plan Problem One     Most Recent Value  Care Plan Problem One  High Risk for falls  Role Documenting the Problem One  Care Management [place] for Problem One  Active  [facility] Long Term Goal   patient will not have accidental fall within the next 31 days  THN Long Term Goal Start Date  03/24/18  Interventions for Problem One Long Term Goal  reviewed fall precautions and preformed a home fall risk assessment, disucssed importance of using walker as instructed by PT, prescribed EMMI education related to falls  THN CM Short Term Goal #1   patient will review prescribed EMMI education within the next two weeks  THN CM Short Term Goal #1 Start Date  03/24/18  Interventions for Short Term Goal #1  discussed importance of fall education reinforcement, prescribed EMMI  education via email per patient preference  THN CM Short Term Goal #2   patient will participate in all PT appointments within the next 30 days  THN CM Short Term Goal #2 Start Date  03/24/18  Interventions for Short Term Goal #2  discussed importance of PT participation and follow through with PT prescription independently     Talar Fraley E. Rollene Rotunda RN, BSN [facility] Care Management Coordinator [phone]

## 2018-03-28 DIAGNOSIS — M419 Scoliosis, unspecified: Secondary | ICD-10-CM | POA: Diagnosis not present

## 2018-03-28 DIAGNOSIS — M4325 Fusion of spine, thoracolumbar region: Secondary | ICD-10-CM | POA: Diagnosis not present

## 2018-03-31 ENCOUNTER — Other Ambulatory Visit: Payer: Self-pay

## 2018-03-31 NOTE — Patient Outreach (Signed)
[facility]) Care Management  03/31/2018  Maureen Wiggins December 21, 1955 [id number]  Transition of Care week # 3 Successful telephone encounter to the above patient for transition of care post lumbar laminectomy on 03/02/18 at [facility]. Patient states she is "doing OK" She had her first post-op visit 03/28/18 to have her staples removed. States steri strips are intact. Per patient, incision healing well. She is sleeping much better since staple removal.   She has transitioned back to her home with her husband. He works 3rd shift and sleeps during the day. He is "not much help" to patient. She is able to perform basic ADLs independently. Her daughter-in-law provides meals. She has not been released to drive. Anticipates approval to drive at next appointment October 9th.   She continues to walk with her walker. Denies new or recent falls. PT has exceeded visits however patient is anticipating an evaluation visit today for service re-approval. OT has been discontinued.   She denies constipation at this time. Has reduced the amount of pain medication she is taking. Medication's reviewed. No change noted.  Patient again offered [place] [facility] referral for depression/counseling options. Patient denied need. Continues to desire to focus on physical healing. Feels emotional health will improve once her physical health improves.  Plan: Follow up TOC call in 1 week [facility] CM Care Plan Problem One     Most Recent Value  Care Plan Problem One  High Risk for falls  Role Documenting the Problem One  Care Management [place] for Problem One  Active  [facility]N Long Term Goal   patient will not have accidental fall within the next 31 days  THN Long Term Goal Start Date  03/24/18  Interventions for Problem One Long Term Goal  reinforced fall precautions related to utilization of walker in new setting (patient recently moved back home and has not had PT with walker in her home)  [facility] CM Short Term  Goal #1   patient will review prescribed EMMI education within the next two weeks  THN CM Short Term Goal #1 Start Date  03/24/18  Interventions for Short Term Goal #1  encouraged patien to check email for EMMI education  [facility] CM Short Term Goal #2   patient will participate in all PT appointments within the next 30 days  THN CM Short Term Goal #2 Start Date  03/24/18  Interventions for Short Term Goal #2  discussed importance of follow-up with [place] if reassessment is not completed as scheduled     Derrin Currey E. Rollene Rotunda RN, BSN [facility] Care Management Coordinator [phone]

## 2018-04-06 ENCOUNTER — Other Ambulatory Visit: Payer: Self-pay

## 2018-04-06 DIAGNOSIS — F411 Generalized anxiety disorder: Secondary | ICD-10-CM | POA: Diagnosis not present

## 2018-04-06 DIAGNOSIS — I10 Essential (primary) hypertension: Secondary | ICD-10-CM | POA: Diagnosis not present

## 2018-04-06 DIAGNOSIS — M545 Low back pain: Secondary | ICD-10-CM | POA: Diagnosis not present

## 2018-04-06 DIAGNOSIS — K589 Irritable bowel syndrome without diarrhea: Secondary | ICD-10-CM | POA: Diagnosis not present

## 2018-04-06 DIAGNOSIS — G8929 Other chronic pain: Secondary | ICD-10-CM | POA: Diagnosis not present

## 2018-04-06 NOTE — Patient Outreach (Signed)
[facility]) Care Management  04/06/2018  Maureen Wiggins December 29, 1955 [id number]  Transition of Care Encounter week # 4 Successful telephone encounter to the above patient for transition of care post lumbar laminectomy on 03/02/18 at [facility]. Patient awakened by RN CM scheduled phone call. Patient states "I guess I'm doing OK". "I am having a real problem with constipation again". Patient states she has not had a BM since Sunday. She did follow up with her PCP this am who increased her Linzess to 2 capsules in the am and changed her mirylax to QOD. Patient states PCP has "other options" if this treatment does not work for her constipation. Patient admits to taking her increased dose of Linzess upon returning home from [place]. No other medication changes noted.  Patient confirms PT evaluation last week for re-approval for services and states she has received PT yesterday and is scheduled for PT again on Friday. She continues to utilize her walker for ambulation. Denies recent falls. States PT is concerned about a reddened area at the upper part of her incision but feel it is where her bra rubs. Patient has decreased the amount of time she wears her bra. PT will reassess incision site on Friday.   Patient continues to perform all basic ADLs independently. Her children continue to provide meals intermittently and her husband will purchase takeout. Her children cleaned her home this past weekend which took some stress from her. Continues to state her husband is "not any help around the house".  Patient continues to want to focus on physical health and not emotional health. Continues to feel if physical health improves then emotional health will also improve. RN CM reinforced option for social work referral to facilitate counseling options.   Plan: Follow-up TOC call in 1 week  [facility] CM Care Plan Problem One     Most Recent Value  Care Plan Problem One  High Risk for falls  Role Documenting the  Problem One  Care Management [place] for Problem One  Active  [facility]N Long Term Goal   patient will not have accidental fall within the next 31 days  THN Long Term Goal Start Date  03/24/18  Interventions for Problem One Long Term Goal  reinforced fall precautions related to ambulation  THN CM Short Term Goal #1   patient will review prescribed EMMI education within the next two weeks  THN CM Short Term Goal #1 Start Date  03/24/18  [facility] CM Short Term Goal #1 Met Date  04/06/18  [facility] CM Short Term Goal #2   patient will participate in all PT appointments within the next 30 days  THN CM Short Term Goal #2 Start Date  03/24/18  Interventions for Short Term Goal #2  discussed PT extension of services with patient and encouraged ongoing participation     Naleyah Ohlinger E. Rollene Rotunda RN, BSN [facility] Care Management Coordinator [phone]

## 2018-04-14 ENCOUNTER — Other Ambulatory Visit: Payer: Self-pay

## 2018-04-14 NOTE — Patient Outreach (Signed)
[facility]) Care Management  04/14/2018  Maureen Wiggins March 20, 1956 [id number]  Transition of Care Encounter #5 Successful telephone encounter to the above patient for transition of care post lumbar laminectomy on 03/02/18 at [facility]. Patient states she continues to do well and improve physically. She denies new or recent falls. She has "graduated" to walking with a cane. She is able to complete all ADLs independently and is able to do light housework. She returns for surgical follow up on October 1 and hopes to be released to drive at that time. States redness at incision site has cleared although she continues to wear a bra intermittently to prevent additional irritation to incision.  PT continues to work with patient twice weekly. Patient feels she will have 2 more weeks of therapy before discharge. Patient continues to deny needs for additional community resources. She continues to have support of her children and their spouses.   Patient continues to struggle with constipation. Recent treatment of increasing Linzess produced excess abdominal bloating and discomfort. Patient has since decreased Linzess back to 1 tablet daily. Having regular soft BM daily with addition of miralax.   Patient states her mood is beginning to lift however "I still have good days and bad days when I think about my mom and dad". Patient continues to grieve the loss of her parents. Denies need for intervention at this time. States "I can always ask Dr. Lovie Macadamia for an increase in my medicine or a referral to therapy if needed".   Plan: Close TOC Program. Will follow up next week after post surgical follow-up appointment.   THN CM Care Plan Problem One     Most Recent Value  Care Plan Problem One  High Risk for falls  (Pended)   Role Documenting the Problem One  Care Management Coordinator  (Pended)   Care Plan for Problem One  Active  (Pended)   THN Long Term Goal   patient will not have accidental  fall within the next 31 days  (Pended)   THN Long Term Goal Start Date  03/24/18  (Pended)   THN CM Short Term Goal #1   patient will review prescribed EMMI education within the next two weeks  (Pended)   THN CM Short Term Goal #1 Start Date  03/24/18  (Pended)   THN CM Short Term Goal #1 Met Date  04/06/18  (Pended)   THN CM Short Term Goal #2   patient will participate in all PT appointments within the next 30 days  (Pended)   THN CM Short Term Goal #2 Start Date  03/24/18  (Pended)      Hasaan Radde E. Rollene Rotunda RN, BSN [facility] Care Management Coordinator [phone]

## 2018-04-19 DIAGNOSIS — M4325 Fusion of spine, thoracolumbar region: Secondary | ICD-10-CM | POA: Diagnosis not present

## 2018-04-19 DIAGNOSIS — Z981 Arthrodesis status: Secondary | ICD-10-CM | POA: Diagnosis not present

## 2018-04-19 DIAGNOSIS — Z4889 Encounter for other specified surgical aftercare: Secondary | ICD-10-CM | POA: Diagnosis not present

## 2018-04-22 ENCOUNTER — Other Ambulatory Visit: Payer: Self-pay

## 2018-04-22 NOTE — Patient Outreach (Signed)
[facility]) Care Management  04/22/2018  WINTER JOCELYN [birth year] [id number]  Telephone Assessment/Case Closure: Successful telephone encounter to the above patient to follow up on recent second post surgical follow-up visit on October 1. RN CM reviewed appointment notes through [place] prior to this conversation. Patient confirmed appointment went "great". "I am getting better every day".States she has now been released to drive and which has relieved a lot of her stress as she does not like to be dependent upon others. She has a planned [facility] PT appointment this afternoon and anticipates discharge from home health. RN CM stressed importance of continuing home exercises and safety precautions as prescribed. Patient verbalized understanding. She is independent with all ADLs and now all IADLs.  Patient denies new or recent falls. Denies worsening of abdominal bloating or constipation. She continues her medication regimen of 1 Linzess daily and miralax PRN.  Patient states she is beginning to deal with her stress in a more positive way. She is able to "drive away" and take time for herself when she is frustrated with her spouse. She continues to grieve the loss of her parents however refuses referral to Social Work for support groups and/or community resources. She understands to speak to her PCP regarding her stress when needed.  Discussed with patient additional goals she may have related to her physical and mental health. Patient states she does not need additional services. RN CM encouraged patient to transition to Chronic Disease Mgmt for HTN if no other services were needed with Community CM. Patient states "I know why my blood pressure is high. It is stress and I am the only one who can deal with my stress. I don't think I need a Health Coach".  Plan: RN CM will close case as no other services identified for Community Case Management and patient has refused transition to  [employer]. Will send Case Closure letter to PCP.  THN CM Care Plan Problem One     Most Recent Value  Care Plan Problem One  High Risk for falls  Role Documenting the Problem One  Care Management [place] for Problem One  Not Active  THN Long Term Goal   patient will not have accidental fall within the next 31 days  THN Long Term Goal Start Date  03/24/18  [facility] Long Term Goal Met Date  04/22/18  [facility] CM Short Term Goal #1   patient will review prescribed EMMI education within the next two weeks  THN CM Short Term Goal #1 Start Date  03/24/18  [facility] CM Short Term Goal #1 Met Date  04/06/18  [facility] CM Short Term Goal #2   patient will participate in all PT appointments within the next 30 days  THN CM Short Term Goal #2 Start Date  03/24/18  [facility] CM Short Term Goal #2 Met Date  04/22/18     Placida Cambre E. Rollene Rotunda RN, BSN [facility] Care Management Coordinator [phone]

## 2018-04-28 DIAGNOSIS — M1990 Unspecified osteoarthritis, unspecified site: Secondary | ICD-10-CM | POA: Diagnosis not present

## 2018-04-28 DIAGNOSIS — M48061 Spinal stenosis, lumbar region without neurogenic claudication: Secondary | ICD-10-CM | POA: Diagnosis not present

## 2018-04-28 DIAGNOSIS — M545 Low back pain: Secondary | ICD-10-CM | POA: Diagnosis not present

## 2018-04-28 DIAGNOSIS — Z4789 Encounter for other orthopedic aftercare: Secondary | ICD-10-CM | POA: Diagnosis not present

## 2018-04-28 DIAGNOSIS — G8929 Other chronic pain: Secondary | ICD-10-CM | POA: Diagnosis not present

## 2018-05-03 DIAGNOSIS — J3081 Allergic rhinitis due to animal (cat) (dog) hair and dander: Secondary | ICD-10-CM | POA: Diagnosis not present

## 2018-05-03 DIAGNOSIS — J3089 Other allergic rhinitis: Secondary | ICD-10-CM | POA: Diagnosis not present

## 2018-05-12 DIAGNOSIS — J3089 Other allergic rhinitis: Secondary | ICD-10-CM | POA: Diagnosis not present

## 2018-05-12 DIAGNOSIS — J3081 Allergic rhinitis due to animal (cat) (dog) hair and dander: Secondary | ICD-10-CM | POA: Diagnosis not present

## 2018-05-17 DIAGNOSIS — J3081 Allergic rhinitis due to animal (cat) (dog) hair and dander: Secondary | ICD-10-CM | POA: Diagnosis not present

## 2018-05-17 DIAGNOSIS — J3089 Other allergic rhinitis: Secondary | ICD-10-CM | POA: Diagnosis not present

## 2018-05-26 DIAGNOSIS — J3089 Other allergic rhinitis: Secondary | ICD-10-CM | POA: Diagnosis not present

## 2018-05-26 DIAGNOSIS — J3081 Allergic rhinitis due to animal (cat) (dog) hair and dander: Secondary | ICD-10-CM | POA: Diagnosis not present

## 2018-06-02 DIAGNOSIS — J3081 Allergic rhinitis due to animal (cat) (dog) hair and dander: Secondary | ICD-10-CM | POA: Diagnosis not present

## 2018-06-02 DIAGNOSIS — J3089 Other allergic rhinitis: Secondary | ICD-10-CM | POA: Diagnosis not present

## 2018-06-07 DIAGNOSIS — J3081 Allergic rhinitis due to animal (cat) (dog) hair and dander: Secondary | ICD-10-CM | POA: Diagnosis not present

## 2018-06-07 DIAGNOSIS — J3089 Other allergic rhinitis: Secondary | ICD-10-CM | POA: Diagnosis not present

## 2018-06-20 ENCOUNTER — Emergency Department: Admission: EM | Admit: 2018-06-20 | Discharge: 2018-06-20 | Discharge status: Against Medical Advice | Payer: PPO

## 2018-06-20 DIAGNOSIS — F329 Major depressive disorder, single episode, unspecified: Secondary | ICD-10-CM | POA: Diagnosis not present

## 2018-06-20 DIAGNOSIS — Z9049 Acquired absence of other specified parts of digestive tract: Secondary | ICD-10-CM | POA: Diagnosis not present

## 2018-06-20 DIAGNOSIS — R412 Retrograde amnesia: Secondary | ICD-10-CM | POA: Diagnosis not present

## 2018-06-20 DIAGNOSIS — I471 Supraventricular tachycardia: Secondary | ICD-10-CM | POA: Diagnosis not present

## 2018-06-20 DIAGNOSIS — J324 Chronic pansinusitis: Secondary | ICD-10-CM | POA: Diagnosis not present

## 2018-06-20 DIAGNOSIS — K7200 Acute and subacute hepatic failure without coma: Secondary | ICD-10-CM | POA: Diagnosis not present

## 2018-06-20 DIAGNOSIS — R918 Other nonspecific abnormal finding of lung field: Secondary | ICD-10-CM | POA: Diagnosis not present

## 2018-06-20 DIAGNOSIS — G8929 Other chronic pain: Secondary | ICD-10-CM | POA: Diagnosis not present

## 2018-06-20 DIAGNOSIS — G4489 Other headache syndrome: Secondary | ICD-10-CM | POA: Diagnosis not present

## 2018-06-20 DIAGNOSIS — D649 Anemia, unspecified: Secondary | ICD-10-CM | POA: Diagnosis not present

## 2018-06-20 DIAGNOSIS — R000 Tachycardia, unspecified: Secondary | ICD-10-CM | POA: Diagnosis not present

## 2018-06-20 DIAGNOSIS — H6690 Otitis media, unspecified, unspecified ear: Secondary | ICD-10-CM | POA: Diagnosis not present

## 2018-06-20 DIAGNOSIS — G9340 Encephalopathy, unspecified: Secondary | ICD-10-CM | POA: Diagnosis not present

## 2018-06-20 DIAGNOSIS — R0902 Hypoxemia: Secondary | ICD-10-CM | POA: Diagnosis not present

## 2018-06-20 DIAGNOSIS — K219 Gastro-esophageal reflux disease without esophagitis: Secondary | ICD-10-CM | POA: Diagnosis not present

## 2018-06-20 DIAGNOSIS — J069 Acute upper respiratory infection, unspecified: Secondary | ICD-10-CM | POA: Diagnosis not present

## 2018-06-20 DIAGNOSIS — Z90722 Acquired absence of ovaries, bilateral: Secondary | ICD-10-CM | POA: Diagnosis not present

## 2018-06-20 DIAGNOSIS — F419 Anxiety disorder, unspecified: Secondary | ICD-10-CM | POA: Diagnosis not present

## 2018-06-20 DIAGNOSIS — E876 Hypokalemia: Secondary | ICD-10-CM | POA: Diagnosis not present

## 2018-06-20 DIAGNOSIS — I10 Essential (primary) hypertension: Secondary | ICD-10-CM | POA: Diagnosis not present

## 2018-06-20 DIAGNOSIS — R5081 Fever presenting with conditions classified elsewhere: Secondary | ICD-10-CM | POA: Diagnosis not present

## 2018-06-20 DIAGNOSIS — I493 Ventricular premature depolarization: Secondary | ICD-10-CM | POA: Diagnosis not present

## 2018-06-20 DIAGNOSIS — G039 Meningitis, unspecified: Secondary | ICD-10-CM | POA: Diagnosis not present

## 2018-06-20 DIAGNOSIS — R945 Abnormal results of liver function studies: Secondary | ICD-10-CM | POA: Diagnosis not present

## 2018-06-20 DIAGNOSIS — J0100 Acute maxillary sinusitis, unspecified: Secondary | ICD-10-CM | POA: Diagnosis not present

## 2018-06-20 DIAGNOSIS — R0600 Dyspnea, unspecified: Secondary | ICD-10-CM | POA: Diagnosis not present

## 2018-06-20 DIAGNOSIS — Z8739 Personal history of other diseases of the musculoskeletal system and connective tissue: Secondary | ICD-10-CM | POA: Diagnosis not present

## 2018-06-20 DIAGNOSIS — Z87891 Personal history of nicotine dependence: Secondary | ICD-10-CM | POA: Diagnosis not present

## 2018-06-20 DIAGNOSIS — A419 Sepsis, unspecified organism: Secondary | ICD-10-CM | POA: Diagnosis not present

## 2018-06-20 DIAGNOSIS — R51 Headache: Secondary | ICD-10-CM | POA: Diagnosis not present

## 2018-06-20 DIAGNOSIS — R509 Fever, unspecified: Secondary | ICD-10-CM | POA: Diagnosis not present

## 2018-06-20 DIAGNOSIS — B349 Viral infection, unspecified: Secondary | ICD-10-CM | POA: Diagnosis not present

## 2018-06-20 DIAGNOSIS — M5030 Other cervical disc degeneration, unspecified cervical region: Secondary | ICD-10-CM | POA: Diagnosis not present

## 2018-06-20 DIAGNOSIS — E871 Hypo-osmolality and hyponatremia: Secondary | ICD-10-CM | POA: Diagnosis not present

## 2018-06-20 DIAGNOSIS — Z981 Arthrodesis status: Secondary | ICD-10-CM | POA: Diagnosis not present

## 2018-06-20 DIAGNOSIS — R531 Weakness: Secondary | ICD-10-CM | POA: Diagnosis not present

## 2018-06-20 DIAGNOSIS — R1110 Vomiting, unspecified: Secondary | ICD-10-CM | POA: Diagnosis not present

## 2018-06-20 DIAGNOSIS — R4182 Altered mental status, unspecified: Secondary | ICD-10-CM | POA: Diagnosis not present

## 2018-06-20 DIAGNOSIS — G894 Chronic pain syndrome: Secondary | ICD-10-CM | POA: Diagnosis not present

## 2018-06-28 DIAGNOSIS — R412 Retrograde amnesia: Secondary | ICD-10-CM | POA: Diagnosis not present

## 2018-06-29 DIAGNOSIS — Z87891 Personal history of nicotine dependence: Secondary | ICD-10-CM | POA: Diagnosis not present

## 2018-06-29 DIAGNOSIS — G894 Chronic pain syndrome: Secondary | ICD-10-CM | POA: Diagnosis not present

## 2018-06-29 DIAGNOSIS — F329 Major depressive disorder, single episode, unspecified: Secondary | ICD-10-CM | POA: Diagnosis not present

## 2018-06-29 DIAGNOSIS — G009 Bacterial meningitis, unspecified: Secondary | ICD-10-CM | POA: Diagnosis not present

## 2018-06-29 DIAGNOSIS — I10 Essential (primary) hypertension: Secondary | ICD-10-CM | POA: Diagnosis not present

## 2018-06-29 DIAGNOSIS — K589 Irritable bowel syndrome without diarrhea: Secondary | ICD-10-CM | POA: Diagnosis not present

## 2018-06-29 DIAGNOSIS — M1990 Unspecified osteoarthritis, unspecified site: Secondary | ICD-10-CM | POA: Diagnosis not present

## 2018-06-29 DIAGNOSIS — J45909 Unspecified asthma, uncomplicated: Secondary | ICD-10-CM | POA: Diagnosis not present

## 2018-06-29 DIAGNOSIS — G43909 Migraine, unspecified, not intractable, without status migrainosus: Secondary | ICD-10-CM | POA: Diagnosis not present

## 2018-06-29 DIAGNOSIS — F419 Anxiety disorder, unspecified: Secondary | ICD-10-CM | POA: Diagnosis not present

## 2018-06-29 DIAGNOSIS — Z9181 History of falling: Secondary | ICD-10-CM | POA: Diagnosis not present

## 2018-06-29 DIAGNOSIS — Z79891 Long term (current) use of opiate analgesic: Secondary | ICD-10-CM | POA: Diagnosis not present

## 2018-06-29 DIAGNOSIS — Z452 Encounter for adjustment and management of vascular access device: Secondary | ICD-10-CM | POA: Diagnosis not present

## 2018-06-29 DIAGNOSIS — Z792 Long term (current) use of antibiotics: Secondary | ICD-10-CM | POA: Diagnosis not present

## 2018-06-29 DIAGNOSIS — Z981 Arthrodesis status: Secondary | ICD-10-CM | POA: Diagnosis not present

## 2018-06-29 DIAGNOSIS — M48061 Spinal stenosis, lumbar region without neurogenic claudication: Secondary | ICD-10-CM | POA: Diagnosis not present

## 2018-06-29 DIAGNOSIS — M5136 Other intervertebral disc degeneration, lumbar region: Secondary | ICD-10-CM | POA: Diagnosis not present

## 2018-06-30 ENCOUNTER — Other Ambulatory Visit: Payer: Self-pay

## 2018-06-30 MED ORDER — SALINE NASAL SPRAY 0.65 % NA SOLN
1.00 | NASAL | Status: DC
Start: ? — End: 2018-06-30

## 2018-06-30 MED ORDER — DEXTROSE 10 % IV SOLN
12.50 | INTRAVENOUS | Status: DC
Start: ? — End: 2018-06-30

## 2018-06-30 MED ORDER — LIDOCAINE 5 % EX PTCH
1.00 | MEDICATED_PATCH | CUTANEOUS | Status: DC
Start: 2018-06-29 — End: 2018-06-30

## 2018-06-30 MED ORDER — MONTELUKAST SODIUM 10 MG PO TABS
10.00 | ORAL_TABLET | ORAL | Status: DC
Start: 2018-06-28 — End: 2018-06-30

## 2018-06-30 MED ORDER — PANTOPRAZOLE SODIUM 20 MG PO TBEC
20.00 | DELAYED_RELEASE_TABLET | ORAL | Status: DC
Start: 2018-06-29 — End: 2018-06-30

## 2018-06-30 MED ORDER — QUETIAPINE FUMARATE 25 MG PO TABS
25.00 | ORAL_TABLET | ORAL | Status: DC
Start: ? — End: 2018-06-30

## 2018-06-30 MED ORDER — DICLOFENAC SODIUM 1 % TD GEL
2.00 | TRANSDERMAL | Status: DC
Start: 2018-06-28 — End: 2018-06-30

## 2018-06-30 MED ORDER — GENERIC EXTERNAL MEDICATION
5.00 | Status: DC
Start: ? — End: 2018-06-30

## 2018-06-30 MED ORDER — GENERIC EXTERNAL MEDICATION
8.00 | Status: DC
Start: ? — End: 2018-06-30

## 2018-06-30 MED ORDER — GENERIC EXTERNAL MEDICATION
200.00 | Status: DC
Start: ? — End: 2018-06-30

## 2018-06-30 MED ORDER — GUAIFENESIN 100 MG/5ML PO SYRP
200.00 | ORAL_SOLUTION | ORAL | Status: DC
Start: ? — End: 2018-06-30

## 2018-06-30 MED ORDER — METOPROLOL TARTRATE 50 MG PO TABS
50.00 | ORAL_TABLET | ORAL | Status: DC
Start: 2018-06-28 — End: 2018-06-30

## 2018-06-30 MED ORDER — GENERIC EXTERNAL MEDICATION
2.00 | Status: DC
Start: 2018-06-29 — End: 2018-06-30

## 2018-06-30 MED ORDER — SODIUM CHLORIDE FLUSH 0.9 % IV SOLN
10.00 | INTRAVENOUS | Status: DC
Start: 2018-06-28 — End: 2018-06-30

## 2018-06-30 MED ORDER — ACETAMINOPHEN 325 MG PO TABS
650.00 | ORAL_TABLET | ORAL | Status: DC
Start: ? — End: 2018-06-30

## 2018-06-30 MED ORDER — LOPERAMIDE HCL 2 MG PO CAPS
2.00 | ORAL_CAPSULE | ORAL | Status: DC
Start: ? — End: 2018-06-30

## 2018-06-30 MED ORDER — INSULIN LISPRO 100 UNIT/ML ~~LOC~~ SOLN
0.00 | SUBCUTANEOUS | Status: DC
Start: 2018-06-28 — End: 2018-06-30

## 2018-06-30 MED ORDER — GENERIC EXTERNAL MEDICATION
Status: DC
Start: ? — End: 2018-06-30

## 2018-06-30 MED ORDER — ALBUTEROL SULFATE (2.5 MG/3ML) 0.083% IN NEBU
2.50 | INHALATION_SOLUTION | RESPIRATORY_TRACT | Status: DC
Start: ? — End: 2018-06-30

## 2018-06-30 MED ORDER — MELATONIN 3 MG PO TABS
3.00 | ORAL_TABLET | ORAL | Status: DC
Start: ? — End: 2018-06-30

## 2018-06-30 MED ORDER — HYDROXYZINE HCL 25 MG PO TABS
25.00 | ORAL_TABLET | ORAL | Status: DC
Start: ? — End: 2018-06-30

## 2018-06-30 MED ORDER — FAMOTIDINE 20 MG PO TABS
20.00 | ORAL_TABLET | ORAL | Status: DC
Start: 2018-06-28 — End: 2018-06-30

## 2018-06-30 NOTE — Patient Outreach (Signed)
[facility]) Care Management  06/30/2018  TEMPIE GIBEAULT 10/24/1955 [id number]   Referral received. No outreach warranted at this time. Transition of Care  will be completed by primary care provider office who will refer to [facility] care management if needed.  Plan: RN CM will close case.  Jone Baseman, RN, MSN [place] Management Care Management Coordinator Direct Line [phone] Cell [phone] Toll Free: [phone]  Fax: [phone]

## 2018-07-08 DIAGNOSIS — R51 Headache: Secondary | ICD-10-CM

## 2018-07-08 DIAGNOSIS — Z8661 Personal history of infections of the central nervous system: Secondary | ICD-10-CM | POA: Insufficient documentation

## 2018-07-08 DIAGNOSIS — J329 Chronic sinusitis, unspecified: Secondary | ICD-10-CM | POA: Diagnosis not present

## 2018-07-08 DIAGNOSIS — Z23 Encounter for immunization: Secondary | ICD-10-CM | POA: Diagnosis not present

## 2018-07-08 DIAGNOSIS — D649 Anemia, unspecified: Secondary | ICD-10-CM | POA: Diagnosis not present

## 2018-07-08 DIAGNOSIS — I10 Essential (primary) hypertension: Secondary | ICD-10-CM | POA: Diagnosis not present

## 2018-07-08 DIAGNOSIS — G4489 Other headache syndrome: Secondary | ICD-10-CM | POA: Diagnosis not present

## 2018-07-08 DIAGNOSIS — R5383 Other fatigue: Secondary | ICD-10-CM | POA: Diagnosis not present

## 2018-07-08 DIAGNOSIS — R519 Headache, unspecified: Secondary | ICD-10-CM | POA: Insufficient documentation

## 2018-07-08 DIAGNOSIS — M5416 Radiculopathy, lumbar region: Secondary | ICD-10-CM | POA: Diagnosis not present

## 2018-07-16 DIAGNOSIS — R197 Diarrhea, unspecified: Secondary | ICD-10-CM | POA: Diagnosis not present

## 2018-07-16 DIAGNOSIS — K219 Gastro-esophageal reflux disease without esophagitis: Secondary | ICD-10-CM | POA: Diagnosis not present

## 2018-07-16 DIAGNOSIS — Z9049 Acquired absence of other specified parts of digestive tract: Secondary | ICD-10-CM | POA: Diagnosis not present

## 2018-07-16 DIAGNOSIS — R079 Chest pain, unspecified: Secondary | ICD-10-CM | POA: Diagnosis not present

## 2018-07-16 DIAGNOSIS — K7110 Toxic liver disease with hepatic necrosis, without coma: Secondary | ICD-10-CM | POA: Diagnosis not present

## 2018-07-16 DIAGNOSIS — J09X2 Influenza due to identified novel influenza A virus with other respiratory manifestations: Secondary | ICD-10-CM | POA: Diagnosis not present

## 2018-07-16 DIAGNOSIS — Z8739 Personal history of other diseases of the musculoskeletal system and connective tissue: Secondary | ICD-10-CM | POA: Diagnosis not present

## 2018-07-16 DIAGNOSIS — Z87891 Personal history of nicotine dependence: Secondary | ICD-10-CM | POA: Diagnosis not present

## 2018-07-16 DIAGNOSIS — Z800 Family history of malignant neoplasm of digestive organs: Secondary | ICD-10-CM | POA: Diagnosis not present

## 2018-07-16 DIAGNOSIS — Z6830 Body mass index (BMI) 30.0-30.9, adult: Secondary | ICD-10-CM | POA: Diagnosis not present

## 2018-07-16 DIAGNOSIS — I10 Essential (primary) hypertension: Secondary | ICD-10-CM | POA: Diagnosis not present

## 2018-07-16 DIAGNOSIS — R51 Headache: Secondary | ICD-10-CM | POA: Diagnosis not present

## 2018-07-16 DIAGNOSIS — K838 Other specified diseases of biliary tract: Secondary | ICD-10-CM | POA: Diagnosis not present

## 2018-07-16 DIAGNOSIS — G0490 Encephalitis and encephalomyelitis, unspecified: Secondary | ICD-10-CM | POA: Diagnosis not present

## 2018-07-16 DIAGNOSIS — Z7951 Long term (current) use of inhaled steroids: Secondary | ICD-10-CM | POA: Diagnosis not present

## 2018-07-16 DIAGNOSIS — Z981 Arthrodesis status: Secondary | ICD-10-CM | POA: Diagnosis not present

## 2018-07-16 DIAGNOSIS — F329 Major depressive disorder, single episode, unspecified: Secondary | ICD-10-CM | POA: Diagnosis not present

## 2018-07-16 DIAGNOSIS — R509 Fever, unspecified: Secondary | ICD-10-CM | POA: Diagnosis not present

## 2018-07-16 DIAGNOSIS — J45909 Unspecified asthma, uncomplicated: Secondary | ICD-10-CM | POA: Diagnosis not present

## 2018-07-16 DIAGNOSIS — E669 Obesity, unspecified: Secondary | ICD-10-CM | POA: Diagnosis not present

## 2018-07-16 DIAGNOSIS — F419 Anxiety disorder, unspecified: Secondary | ICD-10-CM | POA: Diagnosis not present

## 2018-07-16 DIAGNOSIS — J101 Influenza due to other identified influenza virus with other respiratory manifestations: Secondary | ICD-10-CM | POA: Diagnosis not present

## 2018-07-16 DIAGNOSIS — M542 Cervicalgia: Secondary | ICD-10-CM | POA: Diagnosis not present

## 2018-07-16 DIAGNOSIS — H9201 Otalgia, right ear: Secondary | ICD-10-CM | POA: Diagnosis not present

## 2018-07-16 DIAGNOSIS — R740 Nonspecific elevation of levels of transaminase and lactic acid dehydrogenase [LDH]: Secondary | ICD-10-CM | POA: Diagnosis not present

## 2018-07-16 DIAGNOSIS — E860 Dehydration: Secondary | ICD-10-CM | POA: Diagnosis not present

## 2018-07-16 DIAGNOSIS — M48061 Spinal stenosis, lumbar region without neurogenic claudication: Secondary | ICD-10-CM | POA: Diagnosis not present

## 2018-07-16 DIAGNOSIS — Z90722 Acquired absence of ovaries, bilateral: Secondary | ICD-10-CM | POA: Diagnosis not present

## 2018-07-16 DIAGNOSIS — T361X5A Adverse effect of cephalosporins and other beta-lactam antibiotics, initial encounter: Secondary | ICD-10-CM | POA: Diagnosis not present

## 2018-07-17 DIAGNOSIS — R109 Unspecified abdominal pain: Secondary | ICD-10-CM | POA: Insufficient documentation

## 2018-07-17 DIAGNOSIS — J101 Influenza due to other identified influenza virus with other respiratory manifestations: Secondary | ICD-10-CM | POA: Insufficient documentation

## 2018-07-17 DIAGNOSIS — K7200 Acute and subacute hepatic failure without coma: Secondary | ICD-10-CM | POA: Insufficient documentation

## 2018-07-17 DIAGNOSIS — K759 Inflammatory liver disease, unspecified: Secondary | ICD-10-CM | POA: Insufficient documentation

## 2018-07-18 DIAGNOSIS — E669 Obesity, unspecified: Secondary | ICD-10-CM | POA: Insufficient documentation

## 2018-07-22 ENCOUNTER — Other Ambulatory Visit: Payer: Self-pay | Admitting: *Deleted

## 2018-07-22 DIAGNOSIS — R197 Diarrhea, unspecified: Secondary | ICD-10-CM | POA: Diagnosis not present

## 2018-07-22 DIAGNOSIS — K582 Mixed irritable bowel syndrome: Secondary | ICD-10-CM | POA: Diagnosis not present

## 2018-07-22 DIAGNOSIS — R740 Nonspecific elevation of levels of transaminase and lactic acid dehydrogenase [LDH]: Secondary | ICD-10-CM | POA: Diagnosis not present

## 2018-07-22 DIAGNOSIS — Z09 Encounter for follow-up examination after completed treatment for conditions other than malignant neoplasm: Secondary | ICD-10-CM | POA: Diagnosis not present

## 2018-07-22 NOTE — Patient Outreach (Addendum)
[place] [facility]) Care Management  07/22/2018  Maureen Wiggins November 04, 1955 [id number]   Transition of Care Referral   Referral Date: 07/22/2018 Referral Source: HTA IP discharge Date of Admission: 07/16/18 Diagnosis: (HA, diarrhea)  Influenza A infection, transaminitis,  HTN,  Hx of meningitis, ischemic hepatitis  Date of Discharge: on 07/19/18 Facility: [facility] attempt # 1 Patient is able to verify HIPAA Reviewed and addressed Transitional of care referral with patient Maureen Wiggins is resting at home in her bed and reports she is gradually improving She confirms an appointment for follow up today  She denies any concerns during this call She reports some depression but denies need of [facility] She reports being in medications and speaking with her provider about it   Cm offered CM number and the 24 hour RN number for assistance  CM encouraged a return call from her if needed   Social: Maureen Wiggins lives at home with her family Her husband and sons are supportive. She is independent with all care and has transportation to medical appointments  Conditions: Influenza A infection, transaminitis, IBS, HLD, HTN, depression, anxiety, constipation, allergies, asthma,  chronic back pain s/p fusion, lumbar laminectomy, recent diagnosis of meningo-encephalitis    Medications: denies concerns with taking medications as prescribed, affording medications, side effects of medications and questions about medications   Appointments:  had an office visit today for f/u with Dr Joneen Caraway coletti at [place] and returns to see MD 08/05/2018  Sees Dr Juluis Pitch cardiology 08/05/2018   Advance directives: Denies need for assist with advance directives -has POA     Consent: [facility]N RN CM reviewed [facility] services with patient. Patient gave verbal consent for services. Advised patient that other post discharge calls may occur to assess how the patient is  doing following the recent hospitalization. Patient voiced understanding and was appreciative of f/u call.  Plan: [facility] RN CM will close case at this time as patient has been assessed and no needs identified.   [facility] RN CM sent a successful outreach letter as discussed with [facility] brochure enclosed for review  Pt encouraged to return a call to [facility] RN CM prn   Maureen Rison L. Lavina Hamman, RN, BSN, [place] Management Care Coordinator Direct Number [phone] Mobile number [phone]  Main THN number [phone] Fax number [phone]

## 2018-07-27 ENCOUNTER — Other Ambulatory Visit: Payer: Self-pay | Admitting: Pharmacist

## 2018-07-27 NOTE — Patient Outreach (Signed)
[facility]) Care Management     07/27/2018  Maureen Wiggins 02/06/1956 [id number]  Reason for referral: Medication Reconciliation Post Discharge  Referral source: [employer]  PMHx includes but not limited to: chronic back pain (lumbar stenosis, scoliosis), depression/anxiety, HTN, HLD, osteoarthritis  Contacted patient for medication review, left HIPAA compliant message for her to return my call at her convenience.   Plan - Will f/u in 2-3 business days if I have not heard back from the patient  Catie Darnelle Maffucci, PharmD PGY2 [place] Resident, [place] Phone: [phone]

## 2018-07-28 DIAGNOSIS — J3081 Allergic rhinitis due to animal (cat) (dog) hair and dander: Secondary | ICD-10-CM | POA: Diagnosis not present

## 2018-07-28 DIAGNOSIS — J3089 Other allergic rhinitis: Secondary | ICD-10-CM | POA: Diagnosis not present

## 2018-08-02 DIAGNOSIS — J3081 Allergic rhinitis due to animal (cat) (dog) hair and dander: Secondary | ICD-10-CM | POA: Diagnosis not present

## 2018-08-02 DIAGNOSIS — J3089 Other allergic rhinitis: Secondary | ICD-10-CM | POA: Diagnosis not present

## 2018-08-05 DIAGNOSIS — I471 Supraventricular tachycardia: Secondary | ICD-10-CM | POA: Diagnosis not present

## 2018-08-05 DIAGNOSIS — Z1211 Encounter for screening for malignant neoplasm of colon: Secondary | ICD-10-CM | POA: Diagnosis not present

## 2018-08-05 DIAGNOSIS — I10 Essential (primary) hypertension: Secondary | ICD-10-CM | POA: Diagnosis not present

## 2018-08-05 DIAGNOSIS — J310 Chronic rhinitis: Secondary | ICD-10-CM | POA: Diagnosis not present

## 2018-08-05 DIAGNOSIS — R740 Nonspecific elevation of levels of transaminase and lactic acid dehydrogenase [LDH]: Secondary | ICD-10-CM | POA: Diagnosis not present

## 2018-08-05 DIAGNOSIS — R799 Abnormal finding of blood chemistry, unspecified: Secondary | ICD-10-CM | POA: Diagnosis not present

## 2018-08-05 DIAGNOSIS — Z13220 Encounter for screening for lipoid disorders: Secondary | ICD-10-CM | POA: Diagnosis not present

## 2018-08-09 ENCOUNTER — Other Ambulatory Visit: Payer: Self-pay | Admitting: Pharmacist

## 2018-08-09 NOTE — Patient Outreach (Signed)
[facility]) Care Management  08/09/2018  CHRYSTINA NAFF 24-Feb-1956 [id number]   Reason for referral: Medication Reconciliation Post Discharge  Referral source: [employer]  Will close case d/t inability to engage with patient.   Catie Darnelle Maffucci, PharmD PGY2 Ambulatory Care Pharmacy Resident, [facility] Phone: [phone]

## 2018-08-11 DIAGNOSIS — J3089 Other allergic rhinitis: Secondary | ICD-10-CM | POA: Diagnosis not present

## 2018-08-11 DIAGNOSIS — J3081 Allergic rhinitis due to animal (cat) (dog) hair and dander: Secondary | ICD-10-CM | POA: Diagnosis not present

## 2018-08-18 DIAGNOSIS — J3081 Allergic rhinitis due to animal (cat) (dog) hair and dander: Secondary | ICD-10-CM | POA: Diagnosis not present

## 2018-08-18 DIAGNOSIS — J3089 Other allergic rhinitis: Secondary | ICD-10-CM | POA: Diagnosis not present

## 2018-08-19 DIAGNOSIS — Z1231 Encounter for screening mammogram for malignant neoplasm of breast: Secondary | ICD-10-CM | POA: Diagnosis not present

## 2018-08-22 DIAGNOSIS — I471 Supraventricular tachycardia: Secondary | ICD-10-CM | POA: Diagnosis not present

## 2018-08-23 DIAGNOSIS — M47814 Spondylosis without myelopathy or radiculopathy, thoracic region: Secondary | ICD-10-CM | POA: Diagnosis not present

## 2018-08-23 DIAGNOSIS — M546 Pain in thoracic spine: Secondary | ICD-10-CM | POA: Diagnosis not present

## 2018-08-23 DIAGNOSIS — Z981 Arthrodesis status: Secondary | ICD-10-CM | POA: Diagnosis not present

## 2018-08-23 DIAGNOSIS — M4325 Fusion of spine, thoracolumbar region: Secondary | ICD-10-CM | POA: Diagnosis not present

## 2018-08-23 DIAGNOSIS — M5416 Radiculopathy, lumbar region: Secondary | ICD-10-CM | POA: Diagnosis not present

## 2018-08-30 DIAGNOSIS — J3081 Allergic rhinitis due to animal (cat) (dog) hair and dander: Secondary | ICD-10-CM | POA: Diagnosis not present

## 2018-08-30 DIAGNOSIS — J3089 Other allergic rhinitis: Secondary | ICD-10-CM | POA: Diagnosis not present

## 2018-08-31 ENCOUNTER — Encounter: Payer: Self-pay | Admitting: *Deleted

## 2018-08-31 DIAGNOSIS — Z87898 Personal history of other specified conditions: Secondary | ICD-10-CM | POA: Insufficient documentation

## 2018-08-31 DIAGNOSIS — G43909 Migraine, unspecified, not intractable, without status migrainosus: Secondary | ICD-10-CM | POA: Insufficient documentation

## 2018-08-31 DIAGNOSIS — G5600 Carpal tunnel syndrome, unspecified upper limb: Secondary | ICD-10-CM | POA: Insufficient documentation

## 2018-09-01 DIAGNOSIS — M5416 Radiculopathy, lumbar region: Secondary | ICD-10-CM | POA: Diagnosis not present

## 2018-09-06 DIAGNOSIS — J3089 Other allergic rhinitis: Secondary | ICD-10-CM | POA: Diagnosis not present

## 2018-09-06 DIAGNOSIS — J3081 Allergic rhinitis due to animal (cat) (dog) hair and dander: Secondary | ICD-10-CM | POA: Diagnosis not present

## 2018-09-07 DIAGNOSIS — I471 Supraventricular tachycardia: Secondary | ICD-10-CM | POA: Diagnosis not present

## 2018-09-15 DIAGNOSIS — J3081 Allergic rhinitis due to animal (cat) (dog) hair and dander: Secondary | ICD-10-CM | POA: Diagnosis not present

## 2018-09-15 DIAGNOSIS — J3089 Other allergic rhinitis: Secondary | ICD-10-CM | POA: Diagnosis not present

## 2018-09-16 DIAGNOSIS — Z9682 Presence of neurostimulator: Secondary | ICD-10-CM | POA: Diagnosis not present

## 2018-09-16 DIAGNOSIS — E8941 Symptomatic postprocedural ovarian failure: Secondary | ICD-10-CM | POA: Diagnosis not present

## 2018-09-16 DIAGNOSIS — N3941 Urge incontinence: Secondary | ICD-10-CM | POA: Diagnosis not present

## 2018-09-17 IMAGING — CR DG ANKLE COMPLETE 3+V*R*
3 series · 3 of 3 positions shown · non-contrast
Comparison: None.

CLINICAL DATA: 60-year-old who fell while going down the stairs
into her garage and injured the left ankle. Initial encounter.

EXAM:
RIGHT ANKLE - COMPLETE 3+ VIEW

[ankle ap]
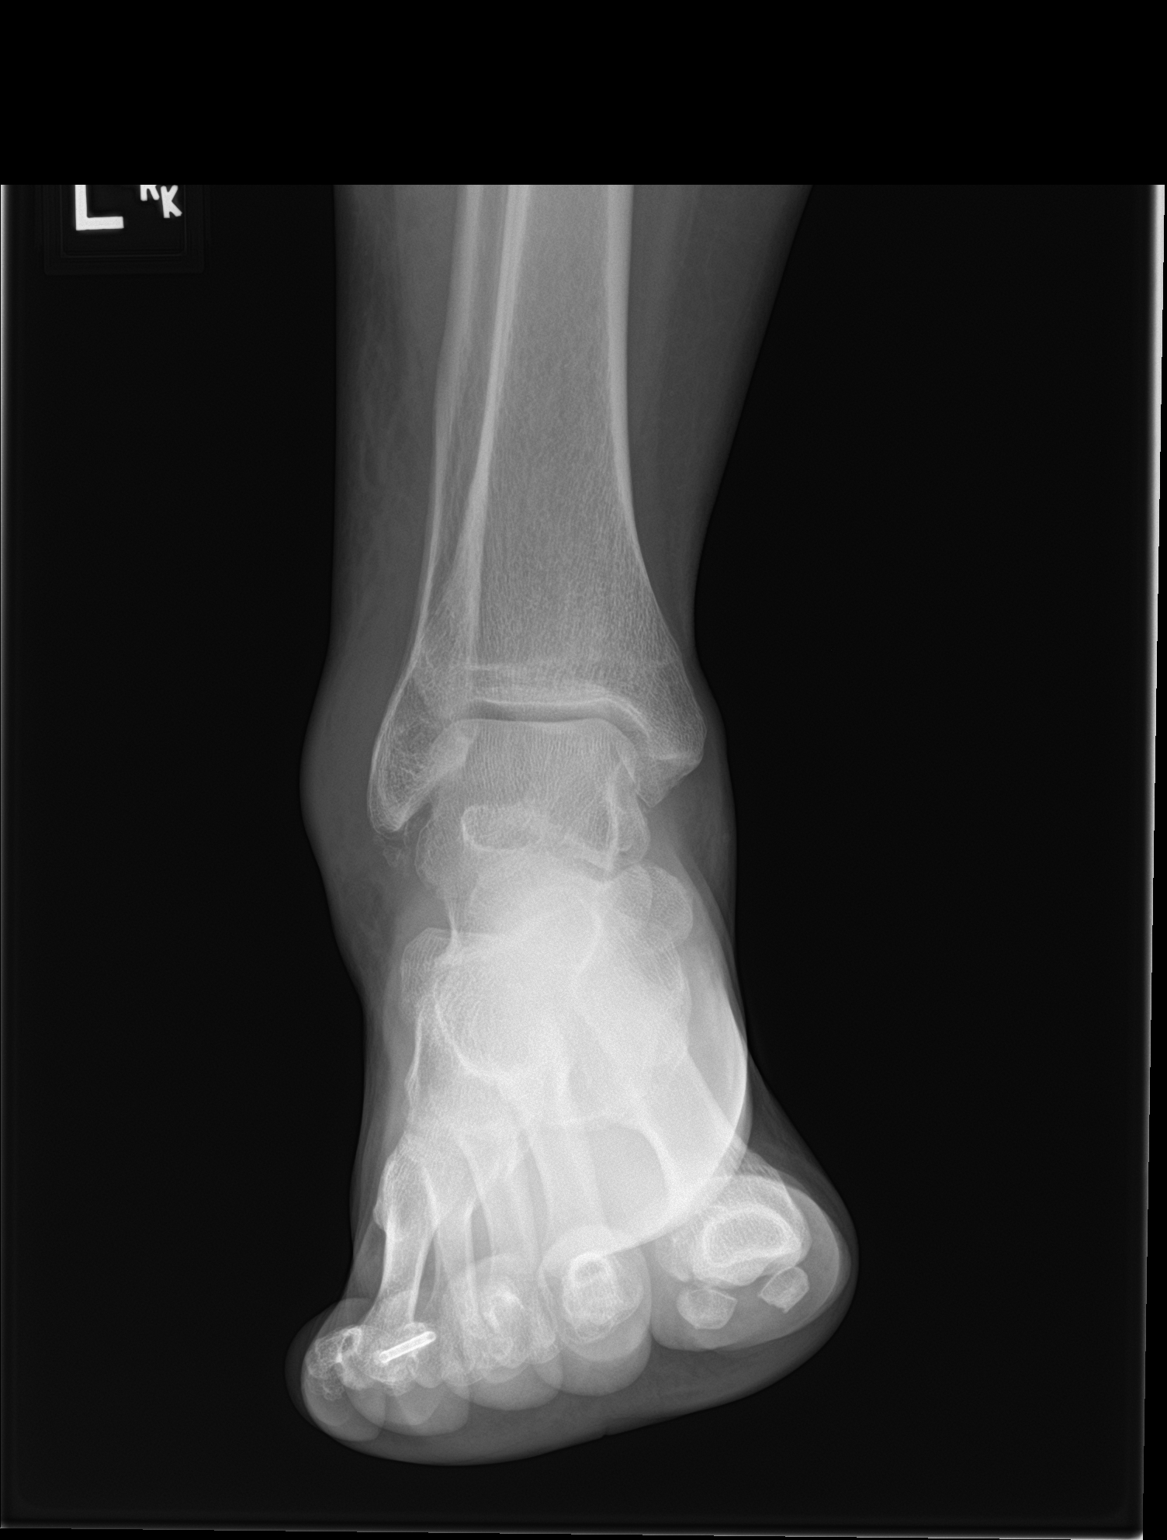

[ankle obl]
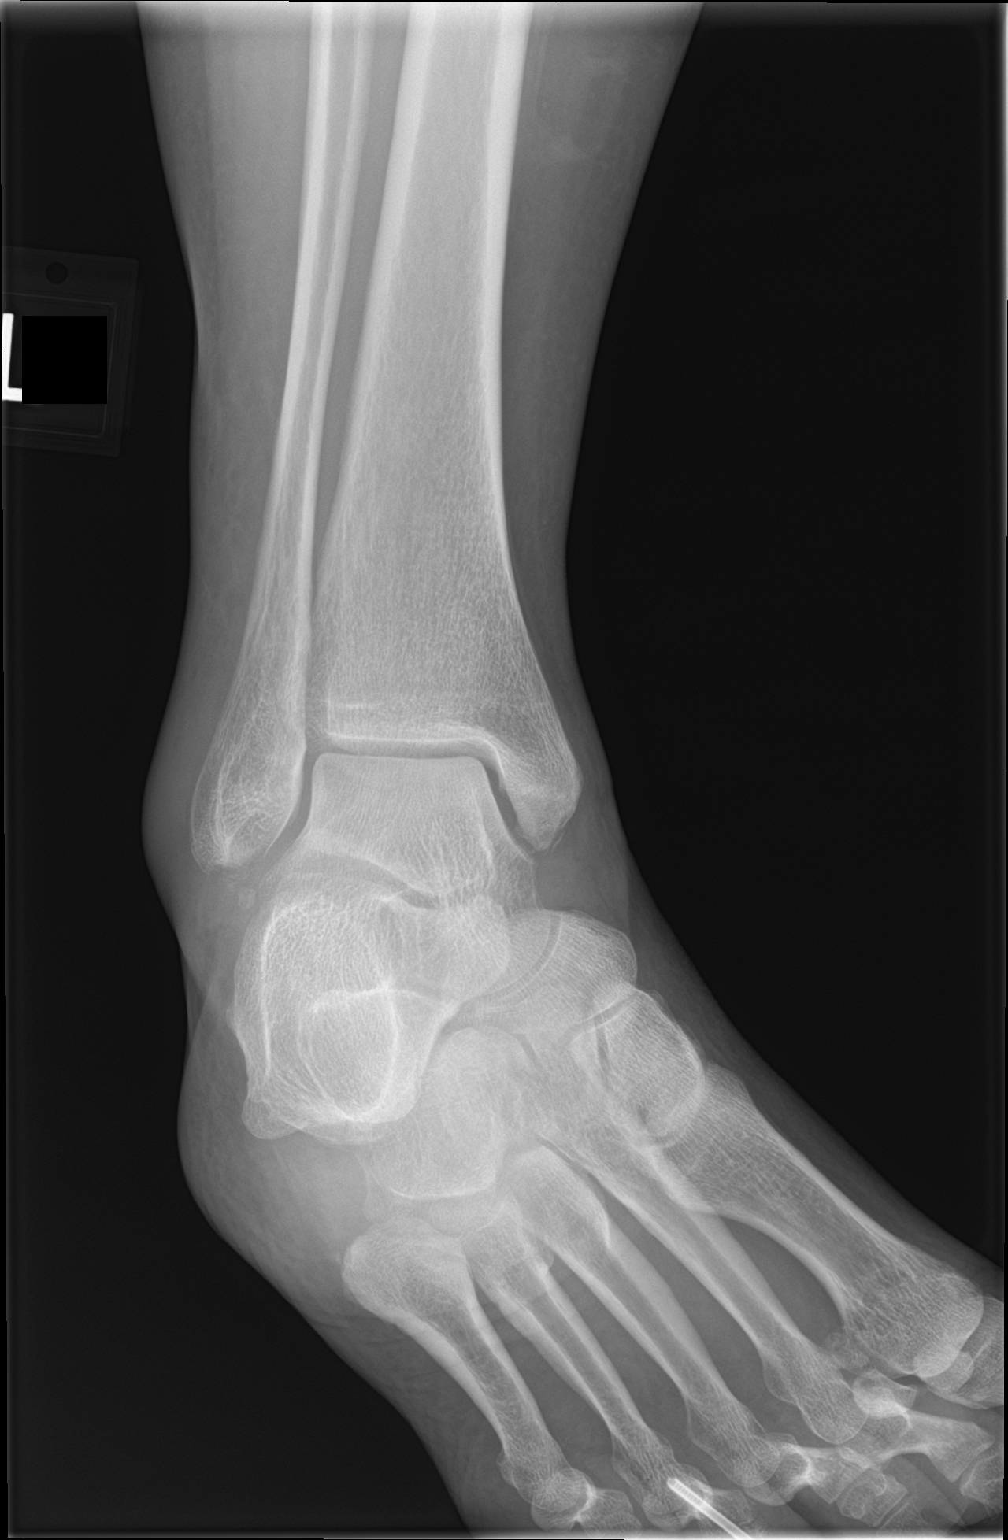

[ankle lat]
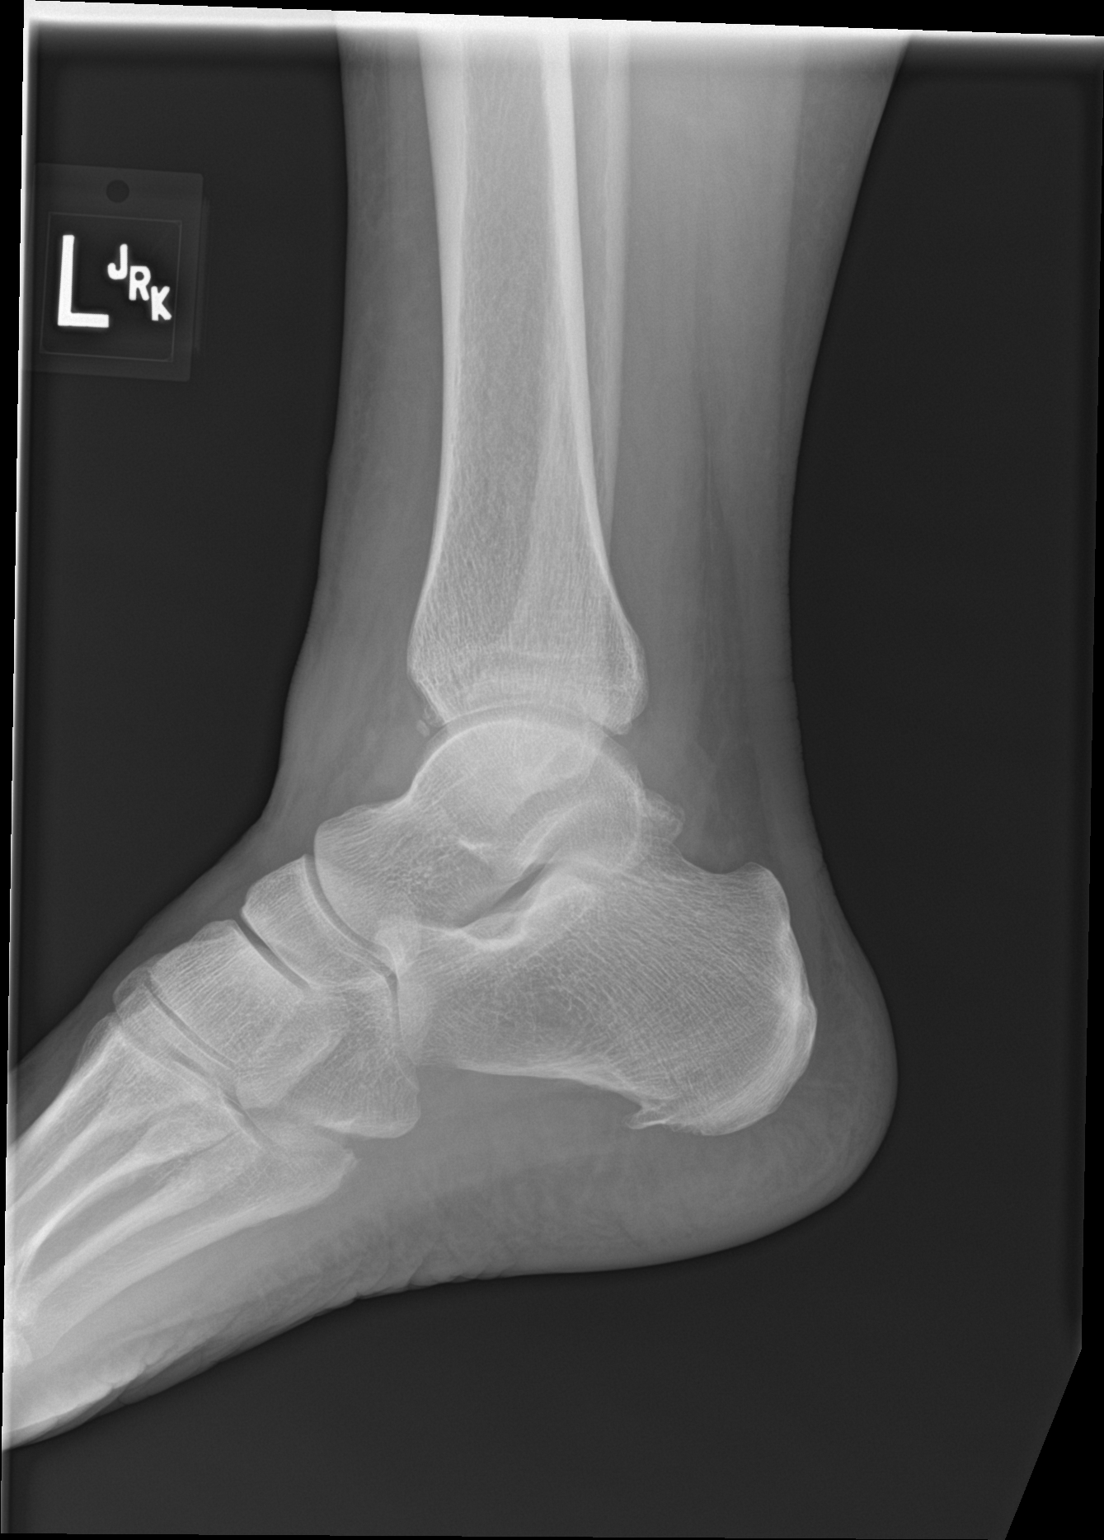

[3 of 3 positions shown; findings below may reference images not displayed]

FINDINGS: Marked lateral and dorsal soft tissue swelling. Avulsion fracture
involving the tip of the lateral malleolus. Accessory ossicle distal
to the tip of the lateral malleolus. No other fractures. Ankle
mortise intact with well-preserved joint space. Bone mineral density
well preserved for age. Large joint effusion/hemarthrosis. Small
plantar calcaneal spur.
IMPRESSION: 1. Avulsion fracture involving the tip of the lateral malleolus.
There is also an accessory ossicle distal to the tip of the lateral
malleolus.
2. Large joint effusion/hemarthrosis.
3. Small plantar calcaneal spur.

## 2018-09-19 DIAGNOSIS — M546 Pain in thoracic spine: Secondary | ICD-10-CM | POA: Diagnosis not present

## 2018-09-19 DIAGNOSIS — M545 Low back pain: Secondary | ICD-10-CM | POA: Diagnosis not present

## 2018-09-19 DIAGNOSIS — R262 Difficulty in walking, not elsewhere classified: Secondary | ICD-10-CM | POA: Diagnosis not present

## 2018-09-22 DIAGNOSIS — J3089 Other allergic rhinitis: Secondary | ICD-10-CM | POA: Diagnosis not present

## 2018-09-22 DIAGNOSIS — J3081 Allergic rhinitis due to animal (cat) (dog) hair and dander: Secondary | ICD-10-CM | POA: Diagnosis not present

## 2018-09-27 DIAGNOSIS — R262 Difficulty in walking, not elsewhere classified: Secondary | ICD-10-CM | POA: Diagnosis not present

## 2018-09-27 DIAGNOSIS — M546 Pain in thoracic spine: Secondary | ICD-10-CM | POA: Diagnosis not present

## 2018-09-27 DIAGNOSIS — M545 Low back pain: Secondary | ICD-10-CM | POA: Diagnosis not present

## 2018-09-29 DIAGNOSIS — J3081 Allergic rhinitis due to animal (cat) (dog) hair and dander: Secondary | ICD-10-CM | POA: Diagnosis not present

## 2018-09-29 DIAGNOSIS — M546 Pain in thoracic spine: Secondary | ICD-10-CM | POA: Diagnosis not present

## 2018-09-29 DIAGNOSIS — J4530 Mild persistent asthma, uncomplicated: Secondary | ICD-10-CM | POA: Diagnosis not present

## 2018-09-29 DIAGNOSIS — M545 Low back pain: Secondary | ICD-10-CM | POA: Diagnosis not present

## 2018-09-29 DIAGNOSIS — R262 Difficulty in walking, not elsewhere classified: Secondary | ICD-10-CM | POA: Diagnosis not present

## 2018-09-29 DIAGNOSIS — J3089 Other allergic rhinitis: Secondary | ICD-10-CM | POA: Diagnosis not present

## 2018-09-29 DIAGNOSIS — H1045 Other chronic allergic conjunctivitis: Secondary | ICD-10-CM | POA: Diagnosis not present

## 2018-10-04 DIAGNOSIS — J3081 Allergic rhinitis due to animal (cat) (dog) hair and dander: Secondary | ICD-10-CM | POA: Diagnosis not present

## 2018-10-04 DIAGNOSIS — J3089 Other allergic rhinitis: Secondary | ICD-10-CM | POA: Diagnosis not present

## 2018-11-01 DIAGNOSIS — J3089 Other allergic rhinitis: Secondary | ICD-10-CM | POA: Diagnosis not present

## 2018-11-01 DIAGNOSIS — J3081 Allergic rhinitis due to animal (cat) (dog) hair and dander: Secondary | ICD-10-CM | POA: Diagnosis not present

## 2018-11-08 DIAGNOSIS — J3081 Allergic rhinitis due to animal (cat) (dog) hair and dander: Secondary | ICD-10-CM | POA: Diagnosis not present

## 2018-11-08 DIAGNOSIS — J3089 Other allergic rhinitis: Secondary | ICD-10-CM | POA: Diagnosis not present

## 2018-11-15 DIAGNOSIS — J3089 Other allergic rhinitis: Secondary | ICD-10-CM | POA: Diagnosis not present

## 2018-11-15 DIAGNOSIS — J3081 Allergic rhinitis due to animal (cat) (dog) hair and dander: Secondary | ICD-10-CM | POA: Diagnosis not present

## 2018-11-24 DIAGNOSIS — J3081 Allergic rhinitis due to animal (cat) (dog) hair and dander: Secondary | ICD-10-CM | POA: Diagnosis not present

## 2018-11-24 DIAGNOSIS — J3089 Other allergic rhinitis: Secondary | ICD-10-CM | POA: Diagnosis not present

## 2018-11-29 DIAGNOSIS — J3081 Allergic rhinitis due to animal (cat) (dog) hair and dander: Secondary | ICD-10-CM | POA: Diagnosis not present

## 2018-11-29 DIAGNOSIS — J3089 Other allergic rhinitis: Secondary | ICD-10-CM | POA: Diagnosis not present

## 2018-12-06 DIAGNOSIS — J3089 Other allergic rhinitis: Secondary | ICD-10-CM | POA: Diagnosis not present

## 2018-12-06 DIAGNOSIS — J3081 Allergic rhinitis due to animal (cat) (dog) hair and dander: Secondary | ICD-10-CM | POA: Diagnosis not present

## 2018-12-13 DIAGNOSIS — J3081 Allergic rhinitis due to animal (cat) (dog) hair and dander: Secondary | ICD-10-CM | POA: Diagnosis not present

## 2018-12-13 DIAGNOSIS — J3089 Other allergic rhinitis: Secondary | ICD-10-CM | POA: Diagnosis not present

## 2018-12-20 DIAGNOSIS — J3081 Allergic rhinitis due to animal (cat) (dog) hair and dander: Secondary | ICD-10-CM | POA: Diagnosis not present

## 2018-12-20 DIAGNOSIS — J3089 Other allergic rhinitis: Secondary | ICD-10-CM | POA: Diagnosis not present

## 2018-12-27 DIAGNOSIS — J3089 Other allergic rhinitis: Secondary | ICD-10-CM | POA: Diagnosis not present

## 2018-12-27 DIAGNOSIS — J3081 Allergic rhinitis due to animal (cat) (dog) hair and dander: Secondary | ICD-10-CM | POA: Diagnosis not present

## 2019-01-05 DIAGNOSIS — J3089 Other allergic rhinitis: Secondary | ICD-10-CM | POA: Diagnosis not present

## 2019-01-05 DIAGNOSIS — J301 Allergic rhinitis due to pollen: Secondary | ICD-10-CM | POA: Diagnosis not present

## 2019-01-06 DIAGNOSIS — Z86010 Personal history of colonic polyps: Secondary | ICD-10-CM | POA: Diagnosis not present

## 2019-01-06 DIAGNOSIS — K644 Residual hemorrhoidal skin tags: Secondary | ICD-10-CM | POA: Diagnosis not present

## 2019-01-06 DIAGNOSIS — K640 First degree hemorrhoids: Secondary | ICD-10-CM | POA: Diagnosis not present

## 2019-01-06 DIAGNOSIS — D122 Benign neoplasm of ascending colon: Secondary | ICD-10-CM | POA: Diagnosis not present

## 2019-01-07 DIAGNOSIS — Z1159 Encounter for screening for other viral diseases: Secondary | ICD-10-CM | POA: Diagnosis not present

## 2019-01-09 DIAGNOSIS — D122 Benign neoplasm of ascending colon: Secondary | ICD-10-CM | POA: Diagnosis not present

## 2019-01-09 DIAGNOSIS — Z1211 Encounter for screening for malignant neoplasm of colon: Secondary | ICD-10-CM | POA: Diagnosis not present

## 2019-01-12 DIAGNOSIS — M419 Scoliosis, unspecified: Secondary | ICD-10-CM | POA: Diagnosis not present

## 2019-01-12 DIAGNOSIS — M4316 Spondylolisthesis, lumbar region: Secondary | ICD-10-CM | POA: Diagnosis not present

## 2019-01-12 DIAGNOSIS — M47814 Spondylosis without myelopathy or radiculopathy, thoracic region: Secondary | ICD-10-CM | POA: Diagnosis not present

## 2019-01-12 DIAGNOSIS — Z981 Arthrodesis status: Secondary | ICD-10-CM | POA: Diagnosis not present

## 2019-01-12 DIAGNOSIS — M4325 Fusion of spine, thoracolumbar region: Secondary | ICD-10-CM | POA: Diagnosis not present

## 2019-01-17 DIAGNOSIS — J3089 Other allergic rhinitis: Secondary | ICD-10-CM | POA: Diagnosis not present

## 2019-01-17 DIAGNOSIS — J3081 Allergic rhinitis due to animal (cat) (dog) hair and dander: Secondary | ICD-10-CM | POA: Diagnosis not present

## 2019-01-25 DIAGNOSIS — J3089 Other allergic rhinitis: Secondary | ICD-10-CM | POA: Diagnosis not present

## 2019-01-25 DIAGNOSIS — J3081 Allergic rhinitis due to animal (cat) (dog) hair and dander: Secondary | ICD-10-CM | POA: Diagnosis not present

## 2019-01-26 DIAGNOSIS — J3089 Other allergic rhinitis: Secondary | ICD-10-CM | POA: Diagnosis not present

## 2019-01-26 DIAGNOSIS — J3081 Allergic rhinitis due to animal (cat) (dog) hair and dander: Secondary | ICD-10-CM | POA: Diagnosis not present

## 2019-02-01 DIAGNOSIS — R262 Difficulty in walking, not elsewhere classified: Secondary | ICD-10-CM | POA: Diagnosis not present

## 2019-02-01 DIAGNOSIS — M545 Low back pain: Secondary | ICD-10-CM | POA: Diagnosis not present

## 2019-02-01 DIAGNOSIS — M546 Pain in thoracic spine: Secondary | ICD-10-CM | POA: Diagnosis not present

## 2019-02-06 DIAGNOSIS — R262 Difficulty in walking, not elsewhere classified: Secondary | ICD-10-CM | POA: Diagnosis not present

## 2019-02-06 DIAGNOSIS — M546 Pain in thoracic spine: Secondary | ICD-10-CM | POA: Diagnosis not present

## 2019-02-06 DIAGNOSIS — M545 Low back pain: Secondary | ICD-10-CM | POA: Diagnosis not present

## 2019-02-07 DIAGNOSIS — E8941 Symptomatic postprocedural ovarian failure: Secondary | ICD-10-CM | POA: Diagnosis not present

## 2019-02-07 DIAGNOSIS — R232 Flushing: Secondary | ICD-10-CM | POA: Diagnosis not present

## 2019-02-07 DIAGNOSIS — N398 Other specified disorders of urinary system: Secondary | ICD-10-CM | POA: Diagnosis not present

## 2019-02-08 DIAGNOSIS — M545 Low back pain: Secondary | ICD-10-CM | POA: Diagnosis not present

## 2019-02-08 DIAGNOSIS — R262 Difficulty in walking, not elsewhere classified: Secondary | ICD-10-CM | POA: Diagnosis not present

## 2019-02-08 DIAGNOSIS — M546 Pain in thoracic spine: Secondary | ICD-10-CM | POA: Diagnosis not present

## 2019-02-09 DIAGNOSIS — J3089 Other allergic rhinitis: Secondary | ICD-10-CM | POA: Diagnosis not present

## 2019-02-09 DIAGNOSIS — J3081 Allergic rhinitis due to animal (cat) (dog) hair and dander: Secondary | ICD-10-CM | POA: Diagnosis not present

## 2019-02-15 DIAGNOSIS — M19041 Primary osteoarthritis, right hand: Secondary | ICD-10-CM | POA: Diagnosis not present

## 2019-02-15 DIAGNOSIS — M546 Pain in thoracic spine: Secondary | ICD-10-CM | POA: Diagnosis not present

## 2019-02-15 DIAGNOSIS — R262 Difficulty in walking, not elsewhere classified: Secondary | ICD-10-CM | POA: Diagnosis not present

## 2019-02-15 DIAGNOSIS — R740 Nonspecific elevation of levels of transaminase and lactic acid dehydrogenase [LDH]: Secondary | ICD-10-CM | POA: Diagnosis not present

## 2019-02-15 DIAGNOSIS — M25541 Pain in joints of right hand: Secondary | ICD-10-CM | POA: Diagnosis not present

## 2019-02-15 DIAGNOSIS — F419 Anxiety disorder, unspecified: Secondary | ICD-10-CM | POA: Diagnosis not present

## 2019-02-15 DIAGNOSIS — M545 Low back pain: Secondary | ICD-10-CM | POA: Diagnosis not present

## 2019-02-15 DIAGNOSIS — M19042 Primary osteoarthritis, left hand: Secondary | ICD-10-CM | POA: Diagnosis not present

## 2019-02-15 DIAGNOSIS — I10 Essential (primary) hypertension: Secondary | ICD-10-CM | POA: Diagnosis not present

## 2019-02-15 DIAGNOSIS — M25542 Pain in joints of left hand: Secondary | ICD-10-CM | POA: Diagnosis not present

## 2019-02-15 DIAGNOSIS — M5416 Radiculopathy, lumbar region: Secondary | ICD-10-CM | POA: Diagnosis not present

## 2019-02-16 DIAGNOSIS — J3089 Other allergic rhinitis: Secondary | ICD-10-CM | POA: Diagnosis not present

## 2019-02-16 DIAGNOSIS — J3081 Allergic rhinitis due to animal (cat) (dog) hair and dander: Secondary | ICD-10-CM | POA: Diagnosis not present

## 2019-02-20 DIAGNOSIS — R262 Difficulty in walking, not elsewhere classified: Secondary | ICD-10-CM | POA: Diagnosis not present

## 2019-02-20 DIAGNOSIS — M546 Pain in thoracic spine: Secondary | ICD-10-CM | POA: Diagnosis not present

## 2019-02-20 DIAGNOSIS — M545 Low back pain: Secondary | ICD-10-CM | POA: Diagnosis not present

## 2019-02-21 DIAGNOSIS — J3089 Other allergic rhinitis: Secondary | ICD-10-CM | POA: Diagnosis not present

## 2019-02-21 DIAGNOSIS — J301 Allergic rhinitis due to pollen: Secondary | ICD-10-CM | POA: Diagnosis not present

## 2019-02-22 DIAGNOSIS — M546 Pain in thoracic spine: Secondary | ICD-10-CM | POA: Diagnosis not present

## 2019-02-22 DIAGNOSIS — R262 Difficulty in walking, not elsewhere classified: Secondary | ICD-10-CM | POA: Diagnosis not present

## 2019-02-22 DIAGNOSIS — M545 Low back pain: Secondary | ICD-10-CM | POA: Diagnosis not present

## 2019-02-27 DIAGNOSIS — R262 Difficulty in walking, not elsewhere classified: Secondary | ICD-10-CM | POA: Diagnosis not present

## 2019-02-27 DIAGNOSIS — M545 Low back pain: Secondary | ICD-10-CM | POA: Diagnosis not present

## 2019-02-27 DIAGNOSIS — M546 Pain in thoracic spine: Secondary | ICD-10-CM | POA: Diagnosis not present

## 2019-03-01 DIAGNOSIS — R262 Difficulty in walking, not elsewhere classified: Secondary | ICD-10-CM | POA: Diagnosis not present

## 2019-03-01 DIAGNOSIS — M546 Pain in thoracic spine: Secondary | ICD-10-CM | POA: Diagnosis not present

## 2019-03-01 DIAGNOSIS — M545 Low back pain: Secondary | ICD-10-CM | POA: Diagnosis not present

## 2019-03-02 DIAGNOSIS — J3089 Other allergic rhinitis: Secondary | ICD-10-CM | POA: Diagnosis not present

## 2019-03-03 DIAGNOSIS — N3281 Overactive bladder: Secondary | ICD-10-CM | POA: Diagnosis not present

## 2019-03-03 DIAGNOSIS — R339 Retention of urine, unspecified: Secondary | ICD-10-CM | POA: Diagnosis not present

## 2019-03-06 DIAGNOSIS — R262 Difficulty in walking, not elsewhere classified: Secondary | ICD-10-CM | POA: Diagnosis not present

## 2019-03-06 DIAGNOSIS — M546 Pain in thoracic spine: Secondary | ICD-10-CM | POA: Diagnosis not present

## 2019-03-06 DIAGNOSIS — M545 Low back pain: Secondary | ICD-10-CM | POA: Diagnosis not present

## 2019-03-08 DIAGNOSIS — R262 Difficulty in walking, not elsewhere classified: Secondary | ICD-10-CM | POA: Diagnosis not present

## 2019-03-08 DIAGNOSIS — M545 Low back pain: Secondary | ICD-10-CM | POA: Diagnosis not present

## 2019-03-08 DIAGNOSIS — M546 Pain in thoracic spine: Secondary | ICD-10-CM | POA: Diagnosis not present

## 2019-03-13 DIAGNOSIS — M545 Low back pain: Secondary | ICD-10-CM | POA: Diagnosis not present

## 2019-03-13 DIAGNOSIS — R262 Difficulty in walking, not elsewhere classified: Secondary | ICD-10-CM | POA: Diagnosis not present

## 2019-03-13 DIAGNOSIS — M546 Pain in thoracic spine: Secondary | ICD-10-CM | POA: Diagnosis not present

## 2019-03-14 DIAGNOSIS — R7989 Other specified abnormal findings of blood chemistry: Secondary | ICD-10-CM | POA: Diagnosis not present

## 2019-03-14 DIAGNOSIS — M65311 Trigger thumb, right thumb: Secondary | ICD-10-CM | POA: Diagnosis not present

## 2019-03-14 DIAGNOSIS — M25531 Pain in right wrist: Secondary | ICD-10-CM | POA: Diagnosis not present

## 2019-03-14 DIAGNOSIS — M1811 Unilateral primary osteoarthritis of first carpometacarpal joint, right hand: Secondary | ICD-10-CM | POA: Diagnosis not present

## 2019-03-20 DIAGNOSIS — M546 Pain in thoracic spine: Secondary | ICD-10-CM | POA: Diagnosis not present

## 2019-03-20 DIAGNOSIS — R262 Difficulty in walking, not elsewhere classified: Secondary | ICD-10-CM | POA: Diagnosis not present

## 2019-03-20 DIAGNOSIS — M545 Low back pain: Secondary | ICD-10-CM | POA: Diagnosis not present

## 2019-03-21 DIAGNOSIS — J3089 Other allergic rhinitis: Secondary | ICD-10-CM | POA: Diagnosis not present

## 2019-03-21 DIAGNOSIS — J3081 Allergic rhinitis due to animal (cat) (dog) hair and dander: Secondary | ICD-10-CM | POA: Diagnosis not present

## 2019-03-28 DIAGNOSIS — M25531 Pain in right wrist: Secondary | ICD-10-CM | POA: Diagnosis not present

## 2019-03-28 DIAGNOSIS — S6991XA Unspecified injury of right wrist, hand and finger(s), initial encounter: Secondary | ICD-10-CM | POA: Diagnosis not present

## 2019-03-28 DIAGNOSIS — J3081 Allergic rhinitis due to animal (cat) (dog) hair and dander: Secondary | ICD-10-CM | POA: Diagnosis not present

## 2019-03-28 DIAGNOSIS — J3089 Other allergic rhinitis: Secondary | ICD-10-CM | POA: Diagnosis not present

## 2019-04-03 DIAGNOSIS — R262 Difficulty in walking, not elsewhere classified: Secondary | ICD-10-CM | POA: Diagnosis not present

## 2019-04-03 DIAGNOSIS — M545 Low back pain: Secondary | ICD-10-CM | POA: Diagnosis not present

## 2019-04-03 DIAGNOSIS — M546 Pain in thoracic spine: Secondary | ICD-10-CM | POA: Diagnosis not present

## 2019-04-04 DIAGNOSIS — J3089 Other allergic rhinitis: Secondary | ICD-10-CM | POA: Diagnosis not present

## 2019-04-04 DIAGNOSIS — J3081 Allergic rhinitis due to animal (cat) (dog) hair and dander: Secondary | ICD-10-CM | POA: Diagnosis not present

## 2019-04-11 DIAGNOSIS — J3081 Allergic rhinitis due to animal (cat) (dog) hair and dander: Secondary | ICD-10-CM | POA: Diagnosis not present

## 2019-04-11 DIAGNOSIS — J3089 Other allergic rhinitis: Secondary | ICD-10-CM | POA: Diagnosis not present

## 2019-04-18 DIAGNOSIS — J3081 Allergic rhinitis due to animal (cat) (dog) hair and dander: Secondary | ICD-10-CM | POA: Diagnosis not present

## 2019-04-18 DIAGNOSIS — J3089 Other allergic rhinitis: Secondary | ICD-10-CM | POA: Diagnosis not present

## 2019-04-25 DIAGNOSIS — J3081 Allergic rhinitis due to animal (cat) (dog) hair and dander: Secondary | ICD-10-CM | POA: Diagnosis not present

## 2019-04-25 DIAGNOSIS — J3089 Other allergic rhinitis: Secondary | ICD-10-CM | POA: Diagnosis not present

## 2019-04-25 DIAGNOSIS — Z23 Encounter for immunization: Secondary | ICD-10-CM | POA: Diagnosis not present

## 2019-05-11 DIAGNOSIS — J3081 Allergic rhinitis due to animal (cat) (dog) hair and dander: Secondary | ICD-10-CM | POA: Diagnosis not present

## 2019-05-11 DIAGNOSIS — J3089 Other allergic rhinitis: Secondary | ICD-10-CM | POA: Diagnosis not present

## 2019-05-16 DIAGNOSIS — Z01818 Encounter for other preprocedural examination: Secondary | ICD-10-CM | POA: Diagnosis not present

## 2019-05-18 DIAGNOSIS — G894 Chronic pain syndrome: Secondary | ICD-10-CM | POA: Diagnosis not present

## 2019-05-18 DIAGNOSIS — K581 Irritable bowel syndrome with constipation: Secondary | ICD-10-CM | POA: Diagnosis not present

## 2019-05-23 DIAGNOSIS — J3089 Other allergic rhinitis: Secondary | ICD-10-CM | POA: Diagnosis not present

## 2019-05-23 DIAGNOSIS — J3081 Allergic rhinitis due to animal (cat) (dog) hair and dander: Secondary | ICD-10-CM | POA: Diagnosis not present

## 2019-05-27 DIAGNOSIS — Z01818 Encounter for other preprocedural examination: Secondary | ICD-10-CM | POA: Diagnosis not present

## 2019-05-30 DIAGNOSIS — T85191A Other mechanical complication of implanted electronic neurostimulator (electrode) of peripheral nerve, initial encounter: Secondary | ICD-10-CM | POA: Diagnosis not present

## 2019-05-30 DIAGNOSIS — Z79899 Other long term (current) drug therapy: Secondary | ICD-10-CM | POA: Diagnosis not present

## 2019-05-30 DIAGNOSIS — Z87891 Personal history of nicotine dependence: Secondary | ICD-10-CM | POA: Diagnosis not present

## 2019-05-30 DIAGNOSIS — N3281 Overactive bladder: Secondary | ICD-10-CM | POA: Diagnosis not present

## 2019-05-30 DIAGNOSIS — I10 Essential (primary) hypertension: Secondary | ICD-10-CM | POA: Diagnosis not present

## 2019-06-08 DIAGNOSIS — J3089 Other allergic rhinitis: Secondary | ICD-10-CM | POA: Diagnosis not present

## 2019-06-08 DIAGNOSIS — J3081 Allergic rhinitis due to animal (cat) (dog) hair and dander: Secondary | ICD-10-CM | POA: Diagnosis not present

## 2019-06-22 DIAGNOSIS — J3089 Other allergic rhinitis: Secondary | ICD-10-CM | POA: Diagnosis not present

## 2019-06-22 DIAGNOSIS — J3081 Allergic rhinitis due to animal (cat) (dog) hair and dander: Secondary | ICD-10-CM | POA: Diagnosis not present

## 2019-06-27 DIAGNOSIS — J3089 Other allergic rhinitis: Secondary | ICD-10-CM | POA: Diagnosis not present

## 2019-06-27 DIAGNOSIS — J3081 Allergic rhinitis due to animal (cat) (dog) hair and dander: Secondary | ICD-10-CM | POA: Diagnosis not present

## 2019-07-06 DIAGNOSIS — J3089 Other allergic rhinitis: Secondary | ICD-10-CM | POA: Diagnosis not present

## 2019-07-06 DIAGNOSIS — J3081 Allergic rhinitis due to animal (cat) (dog) hair and dander: Secondary | ICD-10-CM | POA: Diagnosis not present

## 2019-07-11 DIAGNOSIS — J3081 Allergic rhinitis due to animal (cat) (dog) hair and dander: Secondary | ICD-10-CM | POA: Diagnosis not present

## 2019-07-11 DIAGNOSIS — J3089 Other allergic rhinitis: Secondary | ICD-10-CM | POA: Diagnosis not present

## 2019-07-18 DIAGNOSIS — J3081 Allergic rhinitis due to animal (cat) (dog) hair and dander: Secondary | ICD-10-CM | POA: Diagnosis not present

## 2019-07-18 DIAGNOSIS — J3089 Other allergic rhinitis: Secondary | ICD-10-CM | POA: Diagnosis not present

## 2019-07-25 DIAGNOSIS — Z981 Arthrodesis status: Secondary | ICD-10-CM | POA: Diagnosis not present

## 2019-07-25 DIAGNOSIS — M4325 Fusion of spine, thoracolumbar region: Secondary | ICD-10-CM | POA: Diagnosis not present

## 2019-07-25 DIAGNOSIS — M4004 Postural kyphosis, thoracic region: Secondary | ICD-10-CM | POA: Diagnosis not present

## 2019-07-25 DIAGNOSIS — M818 Other osteoporosis without current pathological fracture: Secondary | ICD-10-CM | POA: Diagnosis not present

## 2019-07-27 DIAGNOSIS — J3089 Other allergic rhinitis: Secondary | ICD-10-CM | POA: Diagnosis not present

## 2019-07-27 DIAGNOSIS — J3081 Allergic rhinitis due to animal (cat) (dog) hair and dander: Secondary | ICD-10-CM | POA: Diagnosis not present

## 2019-08-01 DIAGNOSIS — J3081 Allergic rhinitis due to animal (cat) (dog) hair and dander: Secondary | ICD-10-CM | POA: Diagnosis not present

## 2019-08-01 DIAGNOSIS — J3089 Other allergic rhinitis: Secondary | ICD-10-CM | POA: Diagnosis not present

## 2019-08-03 DIAGNOSIS — J3081 Allergic rhinitis due to animal (cat) (dog) hair and dander: Secondary | ICD-10-CM | POA: Diagnosis not present

## 2019-08-03 DIAGNOSIS — J3089 Other allergic rhinitis: Secondary | ICD-10-CM | POA: Diagnosis not present

## 2019-08-04 DIAGNOSIS — Z20828 Contact with and (suspected) exposure to other viral communicable diseases: Secondary | ICD-10-CM | POA: Diagnosis not present

## 2019-08-15 DIAGNOSIS — J3081 Allergic rhinitis due to animal (cat) (dog) hair and dander: Secondary | ICD-10-CM | POA: Diagnosis not present

## 2019-08-15 DIAGNOSIS — J3089 Other allergic rhinitis: Secondary | ICD-10-CM | POA: Diagnosis not present

## 2019-08-21 DIAGNOSIS — R262 Difficulty in walking, not elsewhere classified: Secondary | ICD-10-CM | POA: Diagnosis not present

## 2019-08-21 DIAGNOSIS — M545 Low back pain: Secondary | ICD-10-CM | POA: Diagnosis not present

## 2019-08-22 DIAGNOSIS — J3081 Allergic rhinitis due to animal (cat) (dog) hair and dander: Secondary | ICD-10-CM | POA: Diagnosis not present

## 2019-08-22 DIAGNOSIS — J3089 Other allergic rhinitis: Secondary | ICD-10-CM | POA: Diagnosis not present

## 2019-08-24 DIAGNOSIS — M545 Low back pain: Secondary | ICD-10-CM | POA: Diagnosis not present

## 2019-08-24 DIAGNOSIS — R262 Difficulty in walking, not elsewhere classified: Secondary | ICD-10-CM | POA: Diagnosis not present

## 2019-08-29 DIAGNOSIS — J3089 Other allergic rhinitis: Secondary | ICD-10-CM | POA: Diagnosis not present

## 2019-08-29 DIAGNOSIS — J3081 Allergic rhinitis due to animal (cat) (dog) hair and dander: Secondary | ICD-10-CM | POA: Diagnosis not present

## 2019-08-29 DIAGNOSIS — M545 Low back pain: Secondary | ICD-10-CM | POA: Diagnosis not present

## 2019-08-29 DIAGNOSIS — R262 Difficulty in walking, not elsewhere classified: Secondary | ICD-10-CM | POA: Diagnosis not present

## 2019-08-31 DIAGNOSIS — M545 Low back pain: Secondary | ICD-10-CM | POA: Diagnosis not present

## 2019-08-31 DIAGNOSIS — R262 Difficulty in walking, not elsewhere classified: Secondary | ICD-10-CM | POA: Diagnosis not present

## 2019-09-05 DIAGNOSIS — R262 Difficulty in walking, not elsewhere classified: Secondary | ICD-10-CM | POA: Diagnosis not present

## 2019-09-05 DIAGNOSIS — M545 Low back pain: Secondary | ICD-10-CM | POA: Diagnosis not present

## 2019-09-12 DIAGNOSIS — J3081 Allergic rhinitis due to animal (cat) (dog) hair and dander: Secondary | ICD-10-CM | POA: Diagnosis not present

## 2019-09-12 DIAGNOSIS — R262 Difficulty in walking, not elsewhere classified: Secondary | ICD-10-CM | POA: Diagnosis not present

## 2019-09-12 DIAGNOSIS — M545 Low back pain: Secondary | ICD-10-CM | POA: Diagnosis not present

## 2019-09-12 DIAGNOSIS — J3089 Other allergic rhinitis: Secondary | ICD-10-CM | POA: Diagnosis not present

## 2019-09-18 DIAGNOSIS — M818 Other osteoporosis without current pathological fracture: Secondary | ICD-10-CM | POA: Diagnosis not present

## 2019-09-21 DIAGNOSIS — M5136 Other intervertebral disc degeneration, lumbar region: Secondary | ICD-10-CM | POA: Diagnosis not present

## 2019-09-21 DIAGNOSIS — M549 Dorsalgia, unspecified: Secondary | ICD-10-CM | POA: Diagnosis not present

## 2019-09-21 DIAGNOSIS — G8929 Other chronic pain: Secondary | ICD-10-CM | POA: Diagnosis not present

## 2019-09-21 DIAGNOSIS — M5134 Other intervertebral disc degeneration, thoracic region: Secondary | ICD-10-CM | POA: Diagnosis not present

## 2019-09-21 DIAGNOSIS — J3089 Other allergic rhinitis: Secondary | ICD-10-CM | POA: Diagnosis not present

## 2019-09-21 DIAGNOSIS — M4325 Fusion of spine, thoracolumbar region: Secondary | ICD-10-CM | POA: Diagnosis not present

## 2019-09-21 DIAGNOSIS — J3081 Allergic rhinitis due to animal (cat) (dog) hair and dander: Secondary | ICD-10-CM | POA: Diagnosis not present

## 2019-09-21 DIAGNOSIS — M5030 Other cervical disc degeneration, unspecified cervical region: Secondary | ICD-10-CM | POA: Diagnosis not present

## 2019-09-21 DIAGNOSIS — Z981 Arthrodesis status: Secondary | ICD-10-CM | POA: Diagnosis not present

## 2019-09-21 DIAGNOSIS — M47812 Spondylosis without myelopathy or radiculopathy, cervical region: Secondary | ICD-10-CM | POA: Diagnosis not present

## 2019-09-28 DIAGNOSIS — J3081 Allergic rhinitis due to animal (cat) (dog) hair and dander: Secondary | ICD-10-CM | POA: Diagnosis not present

## 2019-09-28 DIAGNOSIS — J019 Acute sinusitis, unspecified: Secondary | ICD-10-CM | POA: Diagnosis not present

## 2019-09-28 DIAGNOSIS — R21 Rash and other nonspecific skin eruption: Secondary | ICD-10-CM | POA: Diagnosis not present

## 2019-09-28 DIAGNOSIS — J4530 Mild persistent asthma, uncomplicated: Secondary | ICD-10-CM | POA: Diagnosis not present

## 2019-09-28 DIAGNOSIS — J3089 Other allergic rhinitis: Secondary | ICD-10-CM | POA: Diagnosis not present

## 2019-10-03 DIAGNOSIS — J3081 Allergic rhinitis due to animal (cat) (dog) hair and dander: Secondary | ICD-10-CM | POA: Diagnosis not present

## 2019-10-03 DIAGNOSIS — J301 Allergic rhinitis due to pollen: Secondary | ICD-10-CM | POA: Diagnosis not present

## 2019-10-03 DIAGNOSIS — J3089 Other allergic rhinitis: Secondary | ICD-10-CM | POA: Diagnosis not present

## 2019-10-10 DIAGNOSIS — J3081 Allergic rhinitis due to animal (cat) (dog) hair and dander: Secondary | ICD-10-CM | POA: Diagnosis not present

## 2019-10-10 DIAGNOSIS — J3089 Other allergic rhinitis: Secondary | ICD-10-CM | POA: Diagnosis not present

## 2019-10-17 DIAGNOSIS — J3089 Other allergic rhinitis: Secondary | ICD-10-CM | POA: Diagnosis not present

## 2019-10-17 DIAGNOSIS — J3081 Allergic rhinitis due to animal (cat) (dog) hair and dander: Secondary | ICD-10-CM | POA: Diagnosis not present

## 2019-10-24 DIAGNOSIS — J3089 Other allergic rhinitis: Secondary | ICD-10-CM | POA: Diagnosis not present

## 2019-10-24 DIAGNOSIS — J3081 Allergic rhinitis due to animal (cat) (dog) hair and dander: Secondary | ICD-10-CM | POA: Diagnosis not present

## 2019-10-31 DIAGNOSIS — J3081 Allergic rhinitis due to animal (cat) (dog) hair and dander: Secondary | ICD-10-CM | POA: Diagnosis not present

## 2019-10-31 DIAGNOSIS — J3089 Other allergic rhinitis: Secondary | ICD-10-CM | POA: Diagnosis not present

## 2019-11-07 DIAGNOSIS — J3089 Other allergic rhinitis: Secondary | ICD-10-CM | POA: Diagnosis not present

## 2019-11-07 DIAGNOSIS — J3081 Allergic rhinitis due to animal (cat) (dog) hair and dander: Secondary | ICD-10-CM | POA: Diagnosis not present

## 2019-11-14 DIAGNOSIS — R635 Abnormal weight gain: Secondary | ICD-10-CM | POA: Diagnosis not present

## 2019-11-14 DIAGNOSIS — M5416 Radiculopathy, lumbar region: Secondary | ICD-10-CM | POA: Diagnosis not present

## 2019-11-14 DIAGNOSIS — F419 Anxiety disorder, unspecified: Secondary | ICD-10-CM | POA: Diagnosis not present

## 2019-11-14 DIAGNOSIS — E559 Vitamin D deficiency, unspecified: Secondary | ICD-10-CM | POA: Diagnosis not present

## 2019-11-14 DIAGNOSIS — K582 Mixed irritable bowel syndrome: Secondary | ICD-10-CM | POA: Diagnosis not present

## 2019-11-14 DIAGNOSIS — J310 Chronic rhinitis: Secondary | ICD-10-CM | POA: Diagnosis not present

## 2019-11-14 DIAGNOSIS — I10 Essential (primary) hypertension: Secondary | ICD-10-CM | POA: Diagnosis not present

## 2019-11-28 DIAGNOSIS — J3081 Allergic rhinitis due to animal (cat) (dog) hair and dander: Secondary | ICD-10-CM | POA: Diagnosis not present

## 2019-11-28 DIAGNOSIS — J3089 Other allergic rhinitis: Secondary | ICD-10-CM | POA: Diagnosis not present

## 2019-12-05 DIAGNOSIS — J3089 Other allergic rhinitis: Secondary | ICD-10-CM | POA: Diagnosis not present

## 2019-12-05 DIAGNOSIS — J3081 Allergic rhinitis due to animal (cat) (dog) hair and dander: Secondary | ICD-10-CM | POA: Diagnosis not present

## 2019-12-12 DIAGNOSIS — J3089 Other allergic rhinitis: Secondary | ICD-10-CM | POA: Diagnosis not present

## 2019-12-12 DIAGNOSIS — J3081 Allergic rhinitis due to animal (cat) (dog) hair and dander: Secondary | ICD-10-CM | POA: Diagnosis not present

## 2019-12-19 DIAGNOSIS — M898X1 Other specified disorders of bone, shoulder: Secondary | ICD-10-CM | POA: Diagnosis not present

## 2019-12-19 DIAGNOSIS — G8929 Other chronic pain: Secondary | ICD-10-CM | POA: Diagnosis not present

## 2019-12-19 DIAGNOSIS — M545 Low back pain: Secondary | ICD-10-CM | POA: Diagnosis not present

## 2019-12-19 DIAGNOSIS — M542 Cervicalgia: Secondary | ICD-10-CM | POA: Diagnosis not present

## 2019-12-19 DIAGNOSIS — M7061 Trochanteric bursitis, right hip: Secondary | ICD-10-CM | POA: Diagnosis not present

## 2019-12-21 DIAGNOSIS — J3081 Allergic rhinitis due to animal (cat) (dog) hair and dander: Secondary | ICD-10-CM | POA: Diagnosis not present

## 2019-12-21 DIAGNOSIS — J3089 Other allergic rhinitis: Secondary | ICD-10-CM | POA: Diagnosis not present

## 2019-12-27 DIAGNOSIS — M545 Low back pain: Secondary | ICD-10-CM | POA: Diagnosis not present

## 2019-12-27 DIAGNOSIS — M542 Cervicalgia: Secondary | ICD-10-CM | POA: Diagnosis not present

## 2019-12-27 DIAGNOSIS — R262 Difficulty in walking, not elsewhere classified: Secondary | ICD-10-CM | POA: Diagnosis not present

## 2019-12-27 DIAGNOSIS — M5417 Radiculopathy, lumbosacral region: Secondary | ICD-10-CM | POA: Diagnosis not present

## 2019-12-27 DIAGNOSIS — M7061 Trochanteric bursitis, right hip: Secondary | ICD-10-CM | POA: Diagnosis not present

## 2020-01-02 DIAGNOSIS — M542 Cervicalgia: Secondary | ICD-10-CM | POA: Diagnosis not present

## 2020-01-02 DIAGNOSIS — M7061 Trochanteric bursitis, right hip: Secondary | ICD-10-CM | POA: Diagnosis not present

## 2020-01-02 DIAGNOSIS — M5417 Radiculopathy, lumbosacral region: Secondary | ICD-10-CM | POA: Diagnosis not present

## 2020-01-02 DIAGNOSIS — M545 Low back pain: Secondary | ICD-10-CM | POA: Diagnosis not present

## 2020-01-02 DIAGNOSIS — R262 Difficulty in walking, not elsewhere classified: Secondary | ICD-10-CM | POA: Diagnosis not present

## 2020-01-03 DIAGNOSIS — M461 Sacroiliitis, not elsewhere classified: Secondary | ICD-10-CM | POA: Diagnosis not present

## 2020-01-03 DIAGNOSIS — Z981 Arthrodesis status: Secondary | ICD-10-CM | POA: Diagnosis not present

## 2020-01-03 DIAGNOSIS — M5136 Other intervertebral disc degeneration, lumbar region: Secondary | ICD-10-CM | POA: Diagnosis not present

## 2020-01-04 DIAGNOSIS — M5417 Radiculopathy, lumbosacral region: Secondary | ICD-10-CM | POA: Diagnosis not present

## 2020-01-04 DIAGNOSIS — M7061 Trochanteric bursitis, right hip: Secondary | ICD-10-CM | POA: Diagnosis not present

## 2020-01-04 DIAGNOSIS — M545 Low back pain: Secondary | ICD-10-CM | POA: Diagnosis not present

## 2020-01-04 DIAGNOSIS — J3089 Other allergic rhinitis: Secondary | ICD-10-CM | POA: Diagnosis not present

## 2020-01-04 DIAGNOSIS — J3081 Allergic rhinitis due to animal (cat) (dog) hair and dander: Secondary | ICD-10-CM | POA: Diagnosis not present

## 2020-01-04 DIAGNOSIS — R262 Difficulty in walking, not elsewhere classified: Secondary | ICD-10-CM | POA: Diagnosis not present

## 2020-01-04 DIAGNOSIS — M542 Cervicalgia: Secondary | ICD-10-CM | POA: Diagnosis not present

## 2020-01-09 DIAGNOSIS — M545 Low back pain: Secondary | ICD-10-CM | POA: Diagnosis not present

## 2020-01-09 DIAGNOSIS — M7061 Trochanteric bursitis, right hip: Secondary | ICD-10-CM | POA: Diagnosis not present

## 2020-01-09 DIAGNOSIS — M542 Cervicalgia: Secondary | ICD-10-CM | POA: Diagnosis not present

## 2020-01-09 DIAGNOSIS — M5417 Radiculopathy, lumbosacral region: Secondary | ICD-10-CM | POA: Diagnosis not present

## 2020-01-09 DIAGNOSIS — R262 Difficulty in walking, not elsewhere classified: Secondary | ICD-10-CM | POA: Diagnosis not present

## 2020-01-11 DIAGNOSIS — M545 Low back pain: Secondary | ICD-10-CM | POA: Diagnosis not present

## 2020-01-11 DIAGNOSIS — M542 Cervicalgia: Secondary | ICD-10-CM | POA: Diagnosis not present

## 2020-01-11 DIAGNOSIS — M7061 Trochanteric bursitis, right hip: Secondary | ICD-10-CM | POA: Diagnosis not present

## 2020-01-11 DIAGNOSIS — M5417 Radiculopathy, lumbosacral region: Secondary | ICD-10-CM | POA: Diagnosis not present

## 2020-01-11 DIAGNOSIS — R262 Difficulty in walking, not elsewhere classified: Secondary | ICD-10-CM | POA: Diagnosis not present

## 2020-01-15 DIAGNOSIS — M542 Cervicalgia: Secondary | ICD-10-CM | POA: Diagnosis not present

## 2020-01-15 DIAGNOSIS — M7061 Trochanteric bursitis, right hip: Secondary | ICD-10-CM | POA: Diagnosis not present

## 2020-01-15 DIAGNOSIS — M545 Low back pain: Secondary | ICD-10-CM | POA: Diagnosis not present

## 2020-01-15 DIAGNOSIS — M5417 Radiculopathy, lumbosacral region: Secondary | ICD-10-CM | POA: Diagnosis not present

## 2020-01-15 DIAGNOSIS — R262 Difficulty in walking, not elsewhere classified: Secondary | ICD-10-CM | POA: Diagnosis not present

## 2020-01-17 DIAGNOSIS — R262 Difficulty in walking, not elsewhere classified: Secondary | ICD-10-CM | POA: Diagnosis not present

## 2020-01-17 DIAGNOSIS — M542 Cervicalgia: Secondary | ICD-10-CM | POA: Diagnosis not present

## 2020-01-17 DIAGNOSIS — M5417 Radiculopathy, lumbosacral region: Secondary | ICD-10-CM | POA: Diagnosis not present

## 2020-01-17 DIAGNOSIS — M7061 Trochanteric bursitis, right hip: Secondary | ICD-10-CM | POA: Diagnosis not present

## 2020-01-17 DIAGNOSIS — M545 Low back pain: Secondary | ICD-10-CM | POA: Diagnosis not present

## 2020-01-18 DIAGNOSIS — M461 Sacroiliitis, not elsewhere classified: Secondary | ICD-10-CM | POA: Diagnosis not present

## 2020-01-23 DIAGNOSIS — Z20822 Contact with and (suspected) exposure to covid-19: Secondary | ICD-10-CM | POA: Diagnosis not present

## 2020-01-23 DIAGNOSIS — M542 Cervicalgia: Secondary | ICD-10-CM | POA: Diagnosis not present

## 2020-01-23 DIAGNOSIS — R262 Difficulty in walking, not elsewhere classified: Secondary | ICD-10-CM | POA: Diagnosis not present

## 2020-01-23 DIAGNOSIS — M7061 Trochanteric bursitis, right hip: Secondary | ICD-10-CM | POA: Diagnosis not present

## 2020-01-23 DIAGNOSIS — M5417 Radiculopathy, lumbosacral region: Secondary | ICD-10-CM | POA: Diagnosis not present

## 2020-01-23 DIAGNOSIS — Z03818 Encounter for observation for suspected exposure to other biological agents ruled out: Secondary | ICD-10-CM | POA: Diagnosis not present

## 2020-01-23 DIAGNOSIS — M545 Low back pain: Secondary | ICD-10-CM | POA: Diagnosis not present

## 2020-01-25 DIAGNOSIS — M5417 Radiculopathy, lumbosacral region: Secondary | ICD-10-CM | POA: Diagnosis not present

## 2020-01-25 DIAGNOSIS — M7061 Trochanteric bursitis, right hip: Secondary | ICD-10-CM | POA: Diagnosis not present

## 2020-01-25 DIAGNOSIS — R262 Difficulty in walking, not elsewhere classified: Secondary | ICD-10-CM | POA: Diagnosis not present

## 2020-01-25 DIAGNOSIS — M542 Cervicalgia: Secondary | ICD-10-CM | POA: Diagnosis not present

## 2020-01-25 DIAGNOSIS — M545 Low back pain: Secondary | ICD-10-CM | POA: Diagnosis not present

## 2020-01-26 DIAGNOSIS — J0100 Acute maxillary sinusitis, unspecified: Secondary | ICD-10-CM | POA: Diagnosis not present

## 2020-01-30 DIAGNOSIS — M545 Low back pain: Secondary | ICD-10-CM | POA: Diagnosis not present

## 2020-01-30 DIAGNOSIS — M5417 Radiculopathy, lumbosacral region: Secondary | ICD-10-CM | POA: Diagnosis not present

## 2020-01-30 DIAGNOSIS — M542 Cervicalgia: Secondary | ICD-10-CM | POA: Diagnosis not present

## 2020-01-30 DIAGNOSIS — M7061 Trochanteric bursitis, right hip: Secondary | ICD-10-CM | POA: Diagnosis not present

## 2020-01-30 DIAGNOSIS — R262 Difficulty in walking, not elsewhere classified: Secondary | ICD-10-CM | POA: Diagnosis not present

## 2020-02-06 DIAGNOSIS — M5417 Radiculopathy, lumbosacral region: Secondary | ICD-10-CM | POA: Diagnosis not present

## 2020-02-06 DIAGNOSIS — M545 Low back pain: Secondary | ICD-10-CM | POA: Diagnosis not present

## 2020-02-06 DIAGNOSIS — R262 Difficulty in walking, not elsewhere classified: Secondary | ICD-10-CM | POA: Diagnosis not present

## 2020-02-06 DIAGNOSIS — M7061 Trochanteric bursitis, right hip: Secondary | ICD-10-CM | POA: Diagnosis not present

## 2020-02-06 DIAGNOSIS — M542 Cervicalgia: Secondary | ICD-10-CM | POA: Diagnosis not present

## 2020-02-08 DIAGNOSIS — M545 Low back pain: Secondary | ICD-10-CM | POA: Diagnosis not present

## 2020-02-08 DIAGNOSIS — R262 Difficulty in walking, not elsewhere classified: Secondary | ICD-10-CM | POA: Diagnosis not present

## 2020-02-08 DIAGNOSIS — M542 Cervicalgia: Secondary | ICD-10-CM | POA: Diagnosis not present

## 2020-02-08 DIAGNOSIS — J3081 Allergic rhinitis due to animal (cat) (dog) hair and dander: Secondary | ICD-10-CM | POA: Diagnosis not present

## 2020-02-08 DIAGNOSIS — M7061 Trochanteric bursitis, right hip: Secondary | ICD-10-CM | POA: Diagnosis not present

## 2020-02-08 DIAGNOSIS — M5417 Radiculopathy, lumbosacral region: Secondary | ICD-10-CM | POA: Diagnosis not present

## 2020-02-08 DIAGNOSIS — J3089 Other allergic rhinitis: Secondary | ICD-10-CM | POA: Diagnosis not present

## 2020-02-13 DIAGNOSIS — J3089 Other allergic rhinitis: Secondary | ICD-10-CM | POA: Diagnosis not present

## 2020-02-13 DIAGNOSIS — J3081 Allergic rhinitis due to animal (cat) (dog) hair and dander: Secondary | ICD-10-CM | POA: Diagnosis not present

## 2020-02-14 DIAGNOSIS — M545 Low back pain: Secondary | ICD-10-CM | POA: Diagnosis not present

## 2020-02-14 DIAGNOSIS — M5417 Radiculopathy, lumbosacral region: Secondary | ICD-10-CM | POA: Diagnosis not present

## 2020-02-14 DIAGNOSIS — M542 Cervicalgia: Secondary | ICD-10-CM | POA: Diagnosis not present

## 2020-02-14 DIAGNOSIS — R262 Difficulty in walking, not elsewhere classified: Secondary | ICD-10-CM | POA: Diagnosis not present

## 2020-02-14 DIAGNOSIS — M7061 Trochanteric bursitis, right hip: Secondary | ICD-10-CM | POA: Diagnosis not present

## 2020-02-22 DIAGNOSIS — M545 Low back pain: Secondary | ICD-10-CM | POA: Diagnosis not present

## 2020-02-22 DIAGNOSIS — M5417 Radiculopathy, lumbosacral region: Secondary | ICD-10-CM | POA: Diagnosis not present

## 2020-02-22 DIAGNOSIS — J3089 Other allergic rhinitis: Secondary | ICD-10-CM | POA: Diagnosis not present

## 2020-02-22 DIAGNOSIS — J3081 Allergic rhinitis due to animal (cat) (dog) hair and dander: Secondary | ICD-10-CM | POA: Diagnosis not present

## 2020-02-22 DIAGNOSIS — M542 Cervicalgia: Secondary | ICD-10-CM | POA: Diagnosis not present

## 2020-02-22 DIAGNOSIS — M7061 Trochanteric bursitis, right hip: Secondary | ICD-10-CM | POA: Diagnosis not present

## 2020-02-22 DIAGNOSIS — R262 Difficulty in walking, not elsewhere classified: Secondary | ICD-10-CM | POA: Diagnosis not present

## 2020-02-27 DIAGNOSIS — M545 Low back pain: Secondary | ICD-10-CM | POA: Diagnosis not present

## 2020-02-27 DIAGNOSIS — M5417 Radiculopathy, lumbosacral region: Secondary | ICD-10-CM | POA: Diagnosis not present

## 2020-02-27 DIAGNOSIS — M7061 Trochanteric bursitis, right hip: Secondary | ICD-10-CM | POA: Diagnosis not present

## 2020-02-27 DIAGNOSIS — R262 Difficulty in walking, not elsewhere classified: Secondary | ICD-10-CM | POA: Diagnosis not present

## 2020-02-27 DIAGNOSIS — J3081 Allergic rhinitis due to animal (cat) (dog) hair and dander: Secondary | ICD-10-CM | POA: Diagnosis not present

## 2020-02-27 DIAGNOSIS — J3089 Other allergic rhinitis: Secondary | ICD-10-CM | POA: Diagnosis not present

## 2020-02-27 DIAGNOSIS — M542 Cervicalgia: Secondary | ICD-10-CM | POA: Diagnosis not present

## 2020-03-05 DIAGNOSIS — M7061 Trochanteric bursitis, right hip: Secondary | ICD-10-CM | POA: Diagnosis not present

## 2020-03-05 DIAGNOSIS — R262 Difficulty in walking, not elsewhere classified: Secondary | ICD-10-CM | POA: Diagnosis not present

## 2020-03-05 DIAGNOSIS — M542 Cervicalgia: Secondary | ICD-10-CM | POA: Diagnosis not present

## 2020-03-05 DIAGNOSIS — M545 Low back pain: Secondary | ICD-10-CM | POA: Diagnosis not present

## 2020-03-05 DIAGNOSIS — M5417 Radiculopathy, lumbosacral region: Secondary | ICD-10-CM | POA: Diagnosis not present

## 2020-03-07 DIAGNOSIS — M545 Low back pain: Secondary | ICD-10-CM | POA: Diagnosis not present

## 2020-03-07 DIAGNOSIS — M5417 Radiculopathy, lumbosacral region: Secondary | ICD-10-CM | POA: Diagnosis not present

## 2020-03-07 DIAGNOSIS — R262 Difficulty in walking, not elsewhere classified: Secondary | ICD-10-CM | POA: Diagnosis not present

## 2020-03-07 DIAGNOSIS — M7061 Trochanteric bursitis, right hip: Secondary | ICD-10-CM | POA: Diagnosis not present

## 2020-03-07 DIAGNOSIS — M542 Cervicalgia: Secondary | ICD-10-CM | POA: Diagnosis not present

## 2020-03-12 DIAGNOSIS — R262 Difficulty in walking, not elsewhere classified: Secondary | ICD-10-CM | POA: Diagnosis not present

## 2020-03-12 DIAGNOSIS — M5417 Radiculopathy, lumbosacral region: Secondary | ICD-10-CM | POA: Diagnosis not present

## 2020-03-12 DIAGNOSIS — M7061 Trochanteric bursitis, right hip: Secondary | ICD-10-CM | POA: Diagnosis not present

## 2020-03-12 DIAGNOSIS — M545 Low back pain: Secondary | ICD-10-CM | POA: Diagnosis not present

## 2020-03-12 DIAGNOSIS — M542 Cervicalgia: Secondary | ICD-10-CM | POA: Diagnosis not present

## 2020-03-14 DIAGNOSIS — M542 Cervicalgia: Secondary | ICD-10-CM | POA: Diagnosis not present

## 2020-03-14 DIAGNOSIS — R262 Difficulty in walking, not elsewhere classified: Secondary | ICD-10-CM | POA: Diagnosis not present

## 2020-03-14 DIAGNOSIS — M545 Low back pain: Secondary | ICD-10-CM | POA: Diagnosis not present

## 2020-03-14 DIAGNOSIS — M5417 Radiculopathy, lumbosacral region: Secondary | ICD-10-CM | POA: Diagnosis not present

## 2020-03-14 DIAGNOSIS — M7061 Trochanteric bursitis, right hip: Secondary | ICD-10-CM | POA: Diagnosis not present

## 2020-03-18 DIAGNOSIS — Z79890 Hormone replacement therapy (postmenopausal): Secondary | ICD-10-CM | POA: Diagnosis not present

## 2020-03-18 DIAGNOSIS — Z03818 Encounter for observation for suspected exposure to other biological agents ruled out: Secondary | ICD-10-CM | POA: Diagnosis not present

## 2020-03-18 DIAGNOSIS — F329 Major depressive disorder, single episode, unspecified: Secondary | ICD-10-CM | POA: Diagnosis not present

## 2020-03-18 DIAGNOSIS — R519 Headache, unspecified: Secondary | ICD-10-CM | POA: Diagnosis not present

## 2020-03-18 DIAGNOSIS — F419 Anxiety disorder, unspecified: Secondary | ICD-10-CM | POA: Diagnosis not present

## 2020-03-18 DIAGNOSIS — Z20822 Contact with and (suspected) exposure to covid-19: Secondary | ICD-10-CM | POA: Diagnosis not present

## 2020-03-19 DIAGNOSIS — R262 Difficulty in walking, not elsewhere classified: Secondary | ICD-10-CM | POA: Diagnosis not present

## 2020-03-19 DIAGNOSIS — M542 Cervicalgia: Secondary | ICD-10-CM | POA: Diagnosis not present

## 2020-03-19 DIAGNOSIS — M5417 Radiculopathy, lumbosacral region: Secondary | ICD-10-CM | POA: Diagnosis not present

## 2020-03-19 DIAGNOSIS — M7061 Trochanteric bursitis, right hip: Secondary | ICD-10-CM | POA: Diagnosis not present

## 2020-03-19 DIAGNOSIS — J3089 Other allergic rhinitis: Secondary | ICD-10-CM | POA: Diagnosis not present

## 2020-03-19 DIAGNOSIS — J3081 Allergic rhinitis due to animal (cat) (dog) hair and dander: Secondary | ICD-10-CM | POA: Diagnosis not present

## 2020-03-19 DIAGNOSIS — M545 Low back pain: Secondary | ICD-10-CM | POA: Diagnosis not present

## 2020-03-20 DIAGNOSIS — J0100 Acute maxillary sinusitis, unspecified: Secondary | ICD-10-CM | POA: Diagnosis not present

## 2020-03-21 DIAGNOSIS — M542 Cervicalgia: Secondary | ICD-10-CM | POA: Diagnosis not present

## 2020-03-21 DIAGNOSIS — M5417 Radiculopathy, lumbosacral region: Secondary | ICD-10-CM | POA: Diagnosis not present

## 2020-03-21 DIAGNOSIS — M7061 Trochanteric bursitis, right hip: Secondary | ICD-10-CM | POA: Diagnosis not present

## 2020-03-21 DIAGNOSIS — M545 Low back pain: Secondary | ICD-10-CM | POA: Diagnosis not present

## 2020-03-21 DIAGNOSIS — R262 Difficulty in walking, not elsewhere classified: Secondary | ICD-10-CM | POA: Diagnosis not present

## 2020-04-04 DIAGNOSIS — J3081 Allergic rhinitis due to animal (cat) (dog) hair and dander: Secondary | ICD-10-CM | POA: Diagnosis not present

## 2020-04-04 DIAGNOSIS — J3089 Other allergic rhinitis: Secondary | ICD-10-CM | POA: Diagnosis not present

## 2020-04-11 DIAGNOSIS — J3089 Other allergic rhinitis: Secondary | ICD-10-CM | POA: Diagnosis not present

## 2020-04-11 DIAGNOSIS — J3081 Allergic rhinitis due to animal (cat) (dog) hair and dander: Secondary | ICD-10-CM | POA: Diagnosis not present

## 2020-04-18 DIAGNOSIS — J3089 Other allergic rhinitis: Secondary | ICD-10-CM | POA: Diagnosis not present

## 2020-04-18 DIAGNOSIS — J3081 Allergic rhinitis due to animal (cat) (dog) hair and dander: Secondary | ICD-10-CM | POA: Diagnosis not present

## 2020-04-23 DIAGNOSIS — M5417 Radiculopathy, lumbosacral region: Secondary | ICD-10-CM | POA: Diagnosis not present

## 2020-04-23 DIAGNOSIS — R262 Difficulty in walking, not elsewhere classified: Secondary | ICD-10-CM | POA: Diagnosis not present

## 2020-04-23 DIAGNOSIS — M7061 Trochanteric bursitis, right hip: Secondary | ICD-10-CM | POA: Diagnosis not present

## 2020-04-23 DIAGNOSIS — M542 Cervicalgia: Secondary | ICD-10-CM | POA: Diagnosis not present

## 2020-04-25 DIAGNOSIS — M4325 Fusion of spine, thoracolumbar region: Secondary | ICD-10-CM | POA: Diagnosis not present

## 2020-04-25 DIAGNOSIS — M4180 Other forms of scoliosis, site unspecified: Secondary | ICD-10-CM | POA: Diagnosis not present

## 2020-04-25 DIAGNOSIS — M461 Sacroiliitis, not elsewhere classified: Secondary | ICD-10-CM | POA: Diagnosis not present

## 2020-04-25 DIAGNOSIS — Z981 Arthrodesis status: Secondary | ICD-10-CM | POA: Diagnosis not present

## 2020-04-25 DIAGNOSIS — M21162 Varus deformity, not elsewhere classified, left knee: Secondary | ICD-10-CM | POA: Diagnosis not present

## 2020-04-25 DIAGNOSIS — M21161 Varus deformity, not elsewhere classified, right knee: Secondary | ICD-10-CM | POA: Diagnosis not present

## 2020-04-30 DIAGNOSIS — J3089 Other allergic rhinitis: Secondary | ICD-10-CM | POA: Diagnosis not present

## 2020-04-30 DIAGNOSIS — R262 Difficulty in walking, not elsewhere classified: Secondary | ICD-10-CM | POA: Diagnosis not present

## 2020-04-30 DIAGNOSIS — J3081 Allergic rhinitis due to animal (cat) (dog) hair and dander: Secondary | ICD-10-CM | POA: Diagnosis not present

## 2020-04-30 DIAGNOSIS — M542 Cervicalgia: Secondary | ICD-10-CM | POA: Diagnosis not present

## 2020-04-30 DIAGNOSIS — M5417 Radiculopathy, lumbosacral region: Secondary | ICD-10-CM | POA: Diagnosis not present

## 2020-04-30 DIAGNOSIS — M7061 Trochanteric bursitis, right hip: Secondary | ICD-10-CM | POA: Diagnosis not present

## 2020-05-06 DIAGNOSIS — J3081 Allergic rhinitis due to animal (cat) (dog) hair and dander: Secondary | ICD-10-CM | POA: Diagnosis not present

## 2020-05-06 DIAGNOSIS — J3089 Other allergic rhinitis: Secondary | ICD-10-CM | POA: Diagnosis not present

## 2020-05-09 DIAGNOSIS — M542 Cervicalgia: Secondary | ICD-10-CM | POA: Diagnosis not present

## 2020-05-09 DIAGNOSIS — M5417 Radiculopathy, lumbosacral region: Secondary | ICD-10-CM | POA: Diagnosis not present

## 2020-05-09 DIAGNOSIS — R262 Difficulty in walking, not elsewhere classified: Secondary | ICD-10-CM | POA: Diagnosis not present

## 2020-05-09 DIAGNOSIS — J3089 Other allergic rhinitis: Secondary | ICD-10-CM | POA: Diagnosis not present

## 2020-05-09 DIAGNOSIS — J3081 Allergic rhinitis due to animal (cat) (dog) hair and dander: Secondary | ICD-10-CM | POA: Diagnosis not present

## 2020-05-09 DIAGNOSIS — M7061 Trochanteric bursitis, right hip: Secondary | ICD-10-CM | POA: Diagnosis not present

## 2020-05-13 DIAGNOSIS — M461 Sacroiliitis, not elsewhere classified: Secondary | ICD-10-CM | POA: Diagnosis not present

## 2020-05-13 DIAGNOSIS — M25551 Pain in right hip: Secondary | ICD-10-CM | POA: Diagnosis not present

## 2020-05-13 DIAGNOSIS — M545 Low back pain, unspecified: Secondary | ICD-10-CM | POA: Diagnosis not present

## 2020-05-13 DIAGNOSIS — R200 Anesthesia of skin: Secondary | ICD-10-CM | POA: Diagnosis not present

## 2020-05-14 DIAGNOSIS — J301 Allergic rhinitis due to pollen: Secondary | ICD-10-CM | POA: Diagnosis not present

## 2020-05-14 DIAGNOSIS — J3089 Other allergic rhinitis: Secondary | ICD-10-CM | POA: Diagnosis not present

## 2020-05-14 DIAGNOSIS — J3081 Allergic rhinitis due to animal (cat) (dog) hair and dander: Secondary | ICD-10-CM | POA: Diagnosis not present

## 2020-05-21 DIAGNOSIS — M542 Cervicalgia: Secondary | ICD-10-CM | POA: Diagnosis not present

## 2020-05-21 DIAGNOSIS — R262 Difficulty in walking, not elsewhere classified: Secondary | ICD-10-CM | POA: Diagnosis not present

## 2020-05-21 DIAGNOSIS — M5417 Radiculopathy, lumbosacral region: Secondary | ICD-10-CM | POA: Diagnosis not present

## 2020-05-21 DIAGNOSIS — M7061 Trochanteric bursitis, right hip: Secondary | ICD-10-CM | POA: Diagnosis not present

## 2020-05-23 DIAGNOSIS — R262 Difficulty in walking, not elsewhere classified: Secondary | ICD-10-CM | POA: Diagnosis not present

## 2020-05-23 DIAGNOSIS — M542 Cervicalgia: Secondary | ICD-10-CM | POA: Diagnosis not present

## 2020-05-23 DIAGNOSIS — M5417 Radiculopathy, lumbosacral region: Secondary | ICD-10-CM | POA: Diagnosis not present

## 2020-05-23 DIAGNOSIS — M7061 Trochanteric bursitis, right hip: Secondary | ICD-10-CM | POA: Diagnosis not present

## 2020-05-28 DIAGNOSIS — M5417 Radiculopathy, lumbosacral region: Secondary | ICD-10-CM | POA: Diagnosis not present

## 2020-05-28 DIAGNOSIS — R262 Difficulty in walking, not elsewhere classified: Secondary | ICD-10-CM | POA: Diagnosis not present

## 2020-05-28 DIAGNOSIS — M7061 Trochanteric bursitis, right hip: Secondary | ICD-10-CM | POA: Diagnosis not present

## 2020-05-28 DIAGNOSIS — M542 Cervicalgia: Secondary | ICD-10-CM | POA: Diagnosis not present

## 2020-05-30 DIAGNOSIS — R262 Difficulty in walking, not elsewhere classified: Secondary | ICD-10-CM | POA: Diagnosis not present

## 2020-05-30 DIAGNOSIS — J3089 Other allergic rhinitis: Secondary | ICD-10-CM | POA: Diagnosis not present

## 2020-05-30 DIAGNOSIS — M7061 Trochanteric bursitis, right hip: Secondary | ICD-10-CM | POA: Diagnosis not present

## 2020-05-30 DIAGNOSIS — M5417 Radiculopathy, lumbosacral region: Secondary | ICD-10-CM | POA: Diagnosis not present

## 2020-05-30 DIAGNOSIS — M542 Cervicalgia: Secondary | ICD-10-CM | POA: Diagnosis not present

## 2020-05-30 DIAGNOSIS — J3081 Allergic rhinitis due to animal (cat) (dog) hair and dander: Secondary | ICD-10-CM | POA: Diagnosis not present

## 2020-06-04 DIAGNOSIS — J3089 Other allergic rhinitis: Secondary | ICD-10-CM | POA: Diagnosis not present

## 2020-06-04 DIAGNOSIS — J301 Allergic rhinitis due to pollen: Secondary | ICD-10-CM | POA: Diagnosis not present

## 2020-06-04 DIAGNOSIS — M5417 Radiculopathy, lumbosacral region: Secondary | ICD-10-CM | POA: Diagnosis not present

## 2020-06-04 DIAGNOSIS — R262 Difficulty in walking, not elsewhere classified: Secondary | ICD-10-CM | POA: Diagnosis not present

## 2020-06-04 DIAGNOSIS — M7061 Trochanteric bursitis, right hip: Secondary | ICD-10-CM | POA: Diagnosis not present

## 2020-06-04 DIAGNOSIS — M542 Cervicalgia: Secondary | ICD-10-CM | POA: Diagnosis not present

## 2020-06-11 DIAGNOSIS — J3089 Other allergic rhinitis: Secondary | ICD-10-CM | POA: Diagnosis not present

## 2020-06-11 DIAGNOSIS — M542 Cervicalgia: Secondary | ICD-10-CM | POA: Diagnosis not present

## 2020-06-11 DIAGNOSIS — M5417 Radiculopathy, lumbosacral region: Secondary | ICD-10-CM | POA: Diagnosis not present

## 2020-06-11 DIAGNOSIS — J3081 Allergic rhinitis due to animal (cat) (dog) hair and dander: Secondary | ICD-10-CM | POA: Diagnosis not present

## 2020-06-11 DIAGNOSIS — R262 Difficulty in walking, not elsewhere classified: Secondary | ICD-10-CM | POA: Diagnosis not present

## 2020-06-11 DIAGNOSIS — M7061 Trochanteric bursitis, right hip: Secondary | ICD-10-CM | POA: Diagnosis not present

## 2020-06-18 DIAGNOSIS — M542 Cervicalgia: Secondary | ICD-10-CM | POA: Diagnosis not present

## 2020-06-18 DIAGNOSIS — J3081 Allergic rhinitis due to animal (cat) (dog) hair and dander: Secondary | ICD-10-CM | POA: Diagnosis not present

## 2020-06-18 DIAGNOSIS — J3089 Other allergic rhinitis: Secondary | ICD-10-CM | POA: Diagnosis not present

## 2020-06-18 DIAGNOSIS — M7061 Trochanteric bursitis, right hip: Secondary | ICD-10-CM | POA: Diagnosis not present

## 2020-06-18 DIAGNOSIS — R262 Difficulty in walking, not elsewhere classified: Secondary | ICD-10-CM | POA: Diagnosis not present

## 2020-06-18 DIAGNOSIS — M5417 Radiculopathy, lumbosacral region: Secondary | ICD-10-CM | POA: Diagnosis not present

## 2020-06-20 DIAGNOSIS — M542 Cervicalgia: Secondary | ICD-10-CM | POA: Diagnosis not present

## 2020-06-20 DIAGNOSIS — M7061 Trochanteric bursitis, right hip: Secondary | ICD-10-CM | POA: Diagnosis not present

## 2020-06-20 DIAGNOSIS — R262 Difficulty in walking, not elsewhere classified: Secondary | ICD-10-CM | POA: Diagnosis not present

## 2020-06-20 DIAGNOSIS — M5417 Radiculopathy, lumbosacral region: Secondary | ICD-10-CM | POA: Diagnosis not present

## 2020-06-24 DIAGNOSIS — M542 Cervicalgia: Secondary | ICD-10-CM | POA: Diagnosis not present

## 2020-06-24 DIAGNOSIS — R262 Difficulty in walking, not elsewhere classified: Secondary | ICD-10-CM | POA: Diagnosis not present

## 2020-06-24 DIAGNOSIS — M7061 Trochanteric bursitis, right hip: Secondary | ICD-10-CM | POA: Diagnosis not present

## 2020-06-24 DIAGNOSIS — M5417 Radiculopathy, lumbosacral region: Secondary | ICD-10-CM | POA: Diagnosis not present

## 2020-06-27 DIAGNOSIS — R262 Difficulty in walking, not elsewhere classified: Secondary | ICD-10-CM | POA: Diagnosis not present

## 2020-06-27 DIAGNOSIS — M7061 Trochanteric bursitis, right hip: Secondary | ICD-10-CM | POA: Diagnosis not present

## 2020-06-27 DIAGNOSIS — M5417 Radiculopathy, lumbosacral region: Secondary | ICD-10-CM | POA: Diagnosis not present

## 2020-06-27 DIAGNOSIS — M542 Cervicalgia: Secondary | ICD-10-CM | POA: Diagnosis not present

## 2020-07-02 DIAGNOSIS — J3089 Other allergic rhinitis: Secondary | ICD-10-CM | POA: Diagnosis not present

## 2020-07-02 DIAGNOSIS — J3081 Allergic rhinitis due to animal (cat) (dog) hair and dander: Secondary | ICD-10-CM | POA: Diagnosis not present

## 2020-07-03 DIAGNOSIS — M5417 Radiculopathy, lumbosacral region: Secondary | ICD-10-CM | POA: Diagnosis not present

## 2020-07-03 DIAGNOSIS — M542 Cervicalgia: Secondary | ICD-10-CM | POA: Diagnosis not present

## 2020-07-03 DIAGNOSIS — R262 Difficulty in walking, not elsewhere classified: Secondary | ICD-10-CM | POA: Diagnosis not present

## 2020-07-03 DIAGNOSIS — M7061 Trochanteric bursitis, right hip: Secondary | ICD-10-CM | POA: Diagnosis not present

## 2020-07-05 DIAGNOSIS — M7061 Trochanteric bursitis, right hip: Secondary | ICD-10-CM | POA: Diagnosis not present

## 2020-07-05 DIAGNOSIS — M542 Cervicalgia: Secondary | ICD-10-CM | POA: Diagnosis not present

## 2020-07-05 DIAGNOSIS — M5417 Radiculopathy, lumbosacral region: Secondary | ICD-10-CM | POA: Diagnosis not present

## 2020-07-05 DIAGNOSIS — R262 Difficulty in walking, not elsewhere classified: Secondary | ICD-10-CM | POA: Diagnosis not present

## 2020-07-09 DIAGNOSIS — M542 Cervicalgia: Secondary | ICD-10-CM | POA: Diagnosis not present

## 2020-07-09 DIAGNOSIS — M7061 Trochanteric bursitis, right hip: Secondary | ICD-10-CM | POA: Diagnosis not present

## 2020-07-09 DIAGNOSIS — R262 Difficulty in walking, not elsewhere classified: Secondary | ICD-10-CM | POA: Diagnosis not present

## 2020-07-09 DIAGNOSIS — M5417 Radiculopathy, lumbosacral region: Secondary | ICD-10-CM | POA: Diagnosis not present

## 2020-07-11 DIAGNOSIS — M5417 Radiculopathy, lumbosacral region: Secondary | ICD-10-CM | POA: Diagnosis not present

## 2020-07-11 DIAGNOSIS — M7061 Trochanteric bursitis, right hip: Secondary | ICD-10-CM | POA: Diagnosis not present

## 2020-07-11 DIAGNOSIS — M542 Cervicalgia: Secondary | ICD-10-CM | POA: Diagnosis not present

## 2020-07-11 DIAGNOSIS — R262 Difficulty in walking, not elsewhere classified: Secondary | ICD-10-CM | POA: Diagnosis not present

## 2020-07-17 DIAGNOSIS — M542 Cervicalgia: Secondary | ICD-10-CM | POA: Diagnosis not present

## 2020-07-17 DIAGNOSIS — M7061 Trochanteric bursitis, right hip: Secondary | ICD-10-CM | POA: Diagnosis not present

## 2020-07-17 DIAGNOSIS — R262 Difficulty in walking, not elsewhere classified: Secondary | ICD-10-CM | POA: Diagnosis not present

## 2020-07-17 DIAGNOSIS — M5417 Radiculopathy, lumbosacral region: Secondary | ICD-10-CM | POA: Diagnosis not present

## 2020-07-18 DIAGNOSIS — J3081 Allergic rhinitis due to animal (cat) (dog) hair and dander: Secondary | ICD-10-CM | POA: Diagnosis not present

## 2020-07-18 DIAGNOSIS — J3089 Other allergic rhinitis: Secondary | ICD-10-CM | POA: Diagnosis not present

## 2020-07-23 DIAGNOSIS — R262 Difficulty in walking, not elsewhere classified: Secondary | ICD-10-CM | POA: Diagnosis not present

## 2020-07-23 DIAGNOSIS — M542 Cervicalgia: Secondary | ICD-10-CM | POA: Diagnosis not present

## 2020-07-23 DIAGNOSIS — M5417 Radiculopathy, lumbosacral region: Secondary | ICD-10-CM | POA: Diagnosis not present

## 2020-07-23 DIAGNOSIS — M7061 Trochanteric bursitis, right hip: Secondary | ICD-10-CM | POA: Diagnosis not present

## 2020-07-25 DIAGNOSIS — R262 Difficulty in walking, not elsewhere classified: Secondary | ICD-10-CM | POA: Diagnosis not present

## 2020-07-25 DIAGNOSIS — M542 Cervicalgia: Secondary | ICD-10-CM | POA: Diagnosis not present

## 2020-07-25 DIAGNOSIS — M5417 Radiculopathy, lumbosacral region: Secondary | ICD-10-CM | POA: Diagnosis not present

## 2020-07-25 DIAGNOSIS — M7061 Trochanteric bursitis, right hip: Secondary | ICD-10-CM | POA: Diagnosis not present

## 2020-07-30 DIAGNOSIS — M5417 Radiculopathy, lumbosacral region: Secondary | ICD-10-CM | POA: Diagnosis not present

## 2020-07-30 DIAGNOSIS — R262 Difficulty in walking, not elsewhere classified: Secondary | ICD-10-CM | POA: Diagnosis not present

## 2020-07-30 DIAGNOSIS — M7061 Trochanteric bursitis, right hip: Secondary | ICD-10-CM | POA: Diagnosis not present

## 2020-07-30 DIAGNOSIS — M542 Cervicalgia: Secondary | ICD-10-CM | POA: Diagnosis not present

## 2020-08-01 DIAGNOSIS — M542 Cervicalgia: Secondary | ICD-10-CM | POA: Diagnosis not present

## 2020-08-01 DIAGNOSIS — M7061 Trochanteric bursitis, right hip: Secondary | ICD-10-CM | POA: Diagnosis not present

## 2020-08-01 DIAGNOSIS — R262 Difficulty in walking, not elsewhere classified: Secondary | ICD-10-CM | POA: Diagnosis not present

## 2020-08-01 DIAGNOSIS — M5417 Radiculopathy, lumbosacral region: Secondary | ICD-10-CM | POA: Diagnosis not present

## 2020-08-06 DIAGNOSIS — J3081 Allergic rhinitis due to animal (cat) (dog) hair and dander: Secondary | ICD-10-CM | POA: Diagnosis not present

## 2020-08-06 DIAGNOSIS — J301 Allergic rhinitis due to pollen: Secondary | ICD-10-CM | POA: Diagnosis not present

## 2020-08-06 DIAGNOSIS — J3089 Other allergic rhinitis: Secondary | ICD-10-CM | POA: Diagnosis not present

## 2020-08-13 DIAGNOSIS — M542 Cervicalgia: Secondary | ICD-10-CM | POA: Diagnosis not present

## 2020-08-13 DIAGNOSIS — R262 Difficulty in walking, not elsewhere classified: Secondary | ICD-10-CM | POA: Diagnosis not present

## 2020-08-13 DIAGNOSIS — M7061 Trochanteric bursitis, right hip: Secondary | ICD-10-CM | POA: Diagnosis not present

## 2020-08-13 DIAGNOSIS — J3089 Other allergic rhinitis: Secondary | ICD-10-CM | POA: Diagnosis not present

## 2020-08-13 DIAGNOSIS — J3081 Allergic rhinitis due to animal (cat) (dog) hair and dander: Secondary | ICD-10-CM | POA: Diagnosis not present

## 2020-08-13 DIAGNOSIS — M5417 Radiculopathy, lumbosacral region: Secondary | ICD-10-CM | POA: Diagnosis not present

## 2020-08-15 DIAGNOSIS — M4325 Fusion of spine, thoracolumbar region: Secondary | ICD-10-CM | POA: Diagnosis not present

## 2020-08-15 DIAGNOSIS — M533 Sacrococcygeal disorders, not elsewhere classified: Secondary | ICD-10-CM | POA: Diagnosis not present

## 2020-08-15 DIAGNOSIS — M546 Pain in thoracic spine: Secondary | ICD-10-CM | POA: Diagnosis not present

## 2020-08-15 DIAGNOSIS — M549 Dorsalgia, unspecified: Secondary | ICD-10-CM | POA: Diagnosis not present

## 2020-08-15 DIAGNOSIS — G8929 Other chronic pain: Secondary | ICD-10-CM | POA: Diagnosis not present

## 2020-08-15 DIAGNOSIS — Z981 Arthrodesis status: Secondary | ICD-10-CM | POA: Diagnosis not present

## 2020-08-15 DIAGNOSIS — M4312 Spondylolisthesis, cervical region: Secondary | ICD-10-CM | POA: Diagnosis not present

## 2020-08-15 DIAGNOSIS — M2170 Unequal limb length (acquired), unspecified site: Secondary | ICD-10-CM | POA: Diagnosis not present

## 2020-08-15 DIAGNOSIS — M5134 Other intervertebral disc degeneration, thoracic region: Secondary | ICD-10-CM | POA: Diagnosis not present

## 2020-08-15 DIAGNOSIS — M5030 Other cervical disc degeneration, unspecified cervical region: Secondary | ICD-10-CM | POA: Diagnosis not present

## 2020-08-22 DIAGNOSIS — J3089 Other allergic rhinitis: Secondary | ICD-10-CM | POA: Diagnosis not present

## 2020-08-22 DIAGNOSIS — J3081 Allergic rhinitis due to animal (cat) (dog) hair and dander: Secondary | ICD-10-CM | POA: Diagnosis not present

## 2020-08-27 DIAGNOSIS — J3089 Other allergic rhinitis: Secondary | ICD-10-CM | POA: Diagnosis not present

## 2020-08-27 DIAGNOSIS — J3081 Allergic rhinitis due to animal (cat) (dog) hair and dander: Secondary | ICD-10-CM | POA: Diagnosis not present

## 2020-08-28 DIAGNOSIS — M7061 Trochanteric bursitis, right hip: Secondary | ICD-10-CM | POA: Diagnosis not present

## 2020-08-28 DIAGNOSIS — M5417 Radiculopathy, lumbosacral region: Secondary | ICD-10-CM | POA: Diagnosis not present

## 2020-08-28 DIAGNOSIS — R262 Difficulty in walking, not elsewhere classified: Secondary | ICD-10-CM | POA: Diagnosis not present

## 2020-08-28 DIAGNOSIS — M542 Cervicalgia: Secondary | ICD-10-CM | POA: Diagnosis not present

## 2020-08-30 DIAGNOSIS — R262 Difficulty in walking, not elsewhere classified: Secondary | ICD-10-CM | POA: Diagnosis not present

## 2020-08-30 DIAGNOSIS — M7061 Trochanteric bursitis, right hip: Secondary | ICD-10-CM | POA: Diagnosis not present

## 2020-08-30 DIAGNOSIS — M5417 Radiculopathy, lumbosacral region: Secondary | ICD-10-CM | POA: Diagnosis not present

## 2020-08-30 DIAGNOSIS — M542 Cervicalgia: Secondary | ICD-10-CM | POA: Diagnosis not present

## 2020-09-05 DIAGNOSIS — J3081 Allergic rhinitis due to animal (cat) (dog) hair and dander: Secondary | ICD-10-CM | POA: Diagnosis not present

## 2020-09-05 DIAGNOSIS — J3089 Other allergic rhinitis: Secondary | ICD-10-CM | POA: Diagnosis not present

## 2020-09-06 DIAGNOSIS — M542 Cervicalgia: Secondary | ICD-10-CM | POA: Diagnosis not present

## 2020-09-06 DIAGNOSIS — R262 Difficulty in walking, not elsewhere classified: Secondary | ICD-10-CM | POA: Diagnosis not present

## 2020-09-06 DIAGNOSIS — M7061 Trochanteric bursitis, right hip: Secondary | ICD-10-CM | POA: Diagnosis not present

## 2020-09-06 DIAGNOSIS — M5417 Radiculopathy, lumbosacral region: Secondary | ICD-10-CM | POA: Diagnosis not present

## 2020-09-09 DIAGNOSIS — M5417 Radiculopathy, lumbosacral region: Secondary | ICD-10-CM | POA: Diagnosis not present

## 2020-09-09 DIAGNOSIS — R262 Difficulty in walking, not elsewhere classified: Secondary | ICD-10-CM | POA: Diagnosis not present

## 2020-09-09 DIAGNOSIS — M542 Cervicalgia: Secondary | ICD-10-CM | POA: Diagnosis not present

## 2020-09-09 DIAGNOSIS — M7061 Trochanteric bursitis, right hip: Secondary | ICD-10-CM | POA: Diagnosis not present

## 2020-09-10 DIAGNOSIS — J3089 Other allergic rhinitis: Secondary | ICD-10-CM | POA: Diagnosis not present

## 2020-09-10 DIAGNOSIS — J3081 Allergic rhinitis due to animal (cat) (dog) hair and dander: Secondary | ICD-10-CM | POA: Diagnosis not present

## 2020-09-11 DIAGNOSIS — R262 Difficulty in walking, not elsewhere classified: Secondary | ICD-10-CM | POA: Diagnosis not present

## 2020-09-11 DIAGNOSIS — M5417 Radiculopathy, lumbosacral region: Secondary | ICD-10-CM | POA: Diagnosis not present

## 2020-09-11 DIAGNOSIS — M542 Cervicalgia: Secondary | ICD-10-CM | POA: Diagnosis not present

## 2020-09-11 DIAGNOSIS — M7061 Trochanteric bursitis, right hip: Secondary | ICD-10-CM | POA: Diagnosis not present

## 2020-09-16 DIAGNOSIS — R262 Difficulty in walking, not elsewhere classified: Secondary | ICD-10-CM | POA: Diagnosis not present

## 2020-09-16 DIAGNOSIS — M7061 Trochanteric bursitis, right hip: Secondary | ICD-10-CM | POA: Diagnosis not present

## 2020-09-16 DIAGNOSIS — M542 Cervicalgia: Secondary | ICD-10-CM | POA: Diagnosis not present

## 2020-09-16 DIAGNOSIS — M5417 Radiculopathy, lumbosacral region: Secondary | ICD-10-CM | POA: Diagnosis not present

## 2020-09-17 DIAGNOSIS — J3089 Other allergic rhinitis: Secondary | ICD-10-CM | POA: Diagnosis not present

## 2020-09-17 DIAGNOSIS — J3081 Allergic rhinitis due to animal (cat) (dog) hair and dander: Secondary | ICD-10-CM | POA: Diagnosis not present

## 2020-09-18 DIAGNOSIS — R262 Difficulty in walking, not elsewhere classified: Secondary | ICD-10-CM | POA: Diagnosis not present

## 2020-09-18 DIAGNOSIS — M542 Cervicalgia: Secondary | ICD-10-CM | POA: Diagnosis not present

## 2020-09-18 DIAGNOSIS — M7061 Trochanteric bursitis, right hip: Secondary | ICD-10-CM | POA: Diagnosis not present

## 2020-09-18 DIAGNOSIS — M5417 Radiculopathy, lumbosacral region: Secondary | ICD-10-CM | POA: Diagnosis not present

## 2020-09-24 DIAGNOSIS — J301 Allergic rhinitis due to pollen: Secondary | ICD-10-CM | POA: Diagnosis not present

## 2020-09-24 DIAGNOSIS — J3089 Other allergic rhinitis: Secondary | ICD-10-CM | POA: Diagnosis not present

## 2020-09-24 DIAGNOSIS — J3081 Allergic rhinitis due to animal (cat) (dog) hair and dander: Secondary | ICD-10-CM | POA: Diagnosis not present

## 2020-09-25 DIAGNOSIS — M542 Cervicalgia: Secondary | ICD-10-CM | POA: Diagnosis not present

## 2020-09-25 DIAGNOSIS — R262 Difficulty in walking, not elsewhere classified: Secondary | ICD-10-CM | POA: Diagnosis not present

## 2020-09-25 DIAGNOSIS — M5417 Radiculopathy, lumbosacral region: Secondary | ICD-10-CM | POA: Diagnosis not present

## 2020-09-25 DIAGNOSIS — M7061 Trochanteric bursitis, right hip: Secondary | ICD-10-CM | POA: Diagnosis not present

## 2020-09-30 DIAGNOSIS — M542 Cervicalgia: Secondary | ICD-10-CM | POA: Diagnosis not present

## 2020-09-30 DIAGNOSIS — R262 Difficulty in walking, not elsewhere classified: Secondary | ICD-10-CM | POA: Diagnosis not present

## 2020-09-30 DIAGNOSIS — M7061 Trochanteric bursitis, right hip: Secondary | ICD-10-CM | POA: Diagnosis not present

## 2020-09-30 DIAGNOSIS — M5417 Radiculopathy, lumbosacral region: Secondary | ICD-10-CM | POA: Diagnosis not present

## 2020-10-02 DIAGNOSIS — M542 Cervicalgia: Secondary | ICD-10-CM | POA: Diagnosis not present

## 2020-10-02 DIAGNOSIS — M7061 Trochanteric bursitis, right hip: Secondary | ICD-10-CM | POA: Diagnosis not present

## 2020-10-02 DIAGNOSIS — R262 Difficulty in walking, not elsewhere classified: Secondary | ICD-10-CM | POA: Diagnosis not present

## 2020-10-02 DIAGNOSIS — M5417 Radiculopathy, lumbosacral region: Secondary | ICD-10-CM | POA: Diagnosis not present

## 2020-10-03 DIAGNOSIS — J3089 Other allergic rhinitis: Secondary | ICD-10-CM | POA: Diagnosis not present

## 2020-10-03 DIAGNOSIS — J4530 Mild persistent asthma, uncomplicated: Secondary | ICD-10-CM | POA: Diagnosis not present

## 2020-10-03 DIAGNOSIS — H1045 Other chronic allergic conjunctivitis: Secondary | ICD-10-CM | POA: Diagnosis not present

## 2020-10-03 DIAGNOSIS — J3081 Allergic rhinitis due to animal (cat) (dog) hair and dander: Secondary | ICD-10-CM | POA: Diagnosis not present

## 2020-10-08 DIAGNOSIS — M542 Cervicalgia: Secondary | ICD-10-CM | POA: Diagnosis not present

## 2020-10-08 DIAGNOSIS — M7061 Trochanteric bursitis, right hip: Secondary | ICD-10-CM | POA: Diagnosis not present

## 2020-10-08 DIAGNOSIS — R262 Difficulty in walking, not elsewhere classified: Secondary | ICD-10-CM | POA: Diagnosis not present

## 2020-10-08 DIAGNOSIS — M5417 Radiculopathy, lumbosacral region: Secondary | ICD-10-CM | POA: Diagnosis not present

## 2020-10-10 DIAGNOSIS — J3081 Allergic rhinitis due to animal (cat) (dog) hair and dander: Secondary | ICD-10-CM | POA: Diagnosis not present

## 2020-10-10 DIAGNOSIS — J3089 Other allergic rhinitis: Secondary | ICD-10-CM | POA: Diagnosis not present

## 2020-10-11 DIAGNOSIS — M542 Cervicalgia: Secondary | ICD-10-CM | POA: Diagnosis not present

## 2020-10-11 DIAGNOSIS — M5417 Radiculopathy, lumbosacral region: Secondary | ICD-10-CM | POA: Diagnosis not present

## 2020-10-11 DIAGNOSIS — M7061 Trochanteric bursitis, right hip: Secondary | ICD-10-CM | POA: Diagnosis not present

## 2020-10-11 DIAGNOSIS — R262 Difficulty in walking, not elsewhere classified: Secondary | ICD-10-CM | POA: Diagnosis not present

## 2020-10-14 DIAGNOSIS — M7061 Trochanteric bursitis, right hip: Secondary | ICD-10-CM | POA: Diagnosis not present

## 2020-10-14 DIAGNOSIS — M5417 Radiculopathy, lumbosacral region: Secondary | ICD-10-CM | POA: Diagnosis not present

## 2020-10-14 DIAGNOSIS — M542 Cervicalgia: Secondary | ICD-10-CM | POA: Diagnosis not present

## 2020-10-14 DIAGNOSIS — R262 Difficulty in walking, not elsewhere classified: Secondary | ICD-10-CM | POA: Diagnosis not present

## 2020-10-15 DIAGNOSIS — J3089 Other allergic rhinitis: Secondary | ICD-10-CM | POA: Diagnosis not present

## 2020-10-15 DIAGNOSIS — J301 Allergic rhinitis due to pollen: Secondary | ICD-10-CM | POA: Diagnosis not present

## 2020-10-15 DIAGNOSIS — J3081 Allergic rhinitis due to animal (cat) (dog) hair and dander: Secondary | ICD-10-CM | POA: Diagnosis not present

## 2020-10-22 DIAGNOSIS — J3089 Other allergic rhinitis: Secondary | ICD-10-CM | POA: Diagnosis not present

## 2020-10-22 DIAGNOSIS — J3081 Allergic rhinitis due to animal (cat) (dog) hair and dander: Secondary | ICD-10-CM | POA: Diagnosis not present

## 2020-10-23 DIAGNOSIS — M7061 Trochanteric bursitis, right hip: Secondary | ICD-10-CM | POA: Diagnosis not present

## 2020-10-23 DIAGNOSIS — R262 Difficulty in walking, not elsewhere classified: Secondary | ICD-10-CM | POA: Diagnosis not present

## 2020-10-23 DIAGNOSIS — M542 Cervicalgia: Secondary | ICD-10-CM | POA: Diagnosis not present

## 2020-10-23 DIAGNOSIS — M5417 Radiculopathy, lumbosacral region: Secondary | ICD-10-CM | POA: Diagnosis not present

## 2020-10-29 DIAGNOSIS — J3089 Other allergic rhinitis: Secondary | ICD-10-CM | POA: Diagnosis not present

## 2020-10-29 DIAGNOSIS — J301 Allergic rhinitis due to pollen: Secondary | ICD-10-CM | POA: Diagnosis not present

## 2020-10-29 DIAGNOSIS — J3081 Allergic rhinitis due to animal (cat) (dog) hair and dander: Secondary | ICD-10-CM | POA: Diagnosis not present

## 2020-11-05 DIAGNOSIS — F419 Anxiety disorder, unspecified: Secondary | ICD-10-CM | POA: Diagnosis not present

## 2020-11-05 DIAGNOSIS — J3089 Other allergic rhinitis: Secondary | ICD-10-CM | POA: Diagnosis not present

## 2020-11-05 DIAGNOSIS — E669 Obesity, unspecified: Secondary | ICD-10-CM | POA: Diagnosis not present

## 2020-11-05 DIAGNOSIS — J3081 Allergic rhinitis due to animal (cat) (dog) hair and dander: Secondary | ICD-10-CM | POA: Diagnosis not present

## 2020-11-05 DIAGNOSIS — F32A Depression, unspecified: Secondary | ICD-10-CM | POA: Diagnosis not present

## 2020-11-14 DIAGNOSIS — J3081 Allergic rhinitis due to animal (cat) (dog) hair and dander: Secondary | ICD-10-CM | POA: Diagnosis not present

## 2020-11-14 DIAGNOSIS — J3089 Other allergic rhinitis: Secondary | ICD-10-CM | POA: Diagnosis not present

## 2020-11-25 DIAGNOSIS — K581 Irritable bowel syndrome with constipation: Secondary | ICD-10-CM | POA: Diagnosis not present

## 2020-11-25 DIAGNOSIS — I10 Essential (primary) hypertension: Secondary | ICD-10-CM | POA: Diagnosis not present

## 2020-11-25 DIAGNOSIS — E669 Obesity, unspecified: Secondary | ICD-10-CM | POA: Diagnosis not present

## 2020-11-26 DIAGNOSIS — J3081 Allergic rhinitis due to animal (cat) (dog) hair and dander: Secondary | ICD-10-CM | POA: Diagnosis not present

## 2020-11-26 DIAGNOSIS — J3089 Other allergic rhinitis: Secondary | ICD-10-CM | POA: Diagnosis not present

## 2020-12-03 DIAGNOSIS — J3089 Other allergic rhinitis: Secondary | ICD-10-CM | POA: Diagnosis not present

## 2020-12-03 DIAGNOSIS — J3081 Allergic rhinitis due to animal (cat) (dog) hair and dander: Secondary | ICD-10-CM | POA: Diagnosis not present

## 2020-12-10 DIAGNOSIS — J3081 Allergic rhinitis due to animal (cat) (dog) hair and dander: Secondary | ICD-10-CM | POA: Diagnosis not present

## 2020-12-10 DIAGNOSIS — J3089 Other allergic rhinitis: Secondary | ICD-10-CM | POA: Diagnosis not present

## 2020-12-19 DIAGNOSIS — J3081 Allergic rhinitis due to animal (cat) (dog) hair and dander: Secondary | ICD-10-CM | POA: Diagnosis not present

## 2020-12-19 DIAGNOSIS — J3089 Other allergic rhinitis: Secondary | ICD-10-CM | POA: Diagnosis not present

## 2020-12-24 DIAGNOSIS — J3081 Allergic rhinitis due to animal (cat) (dog) hair and dander: Secondary | ICD-10-CM | POA: Diagnosis not present

## 2020-12-24 DIAGNOSIS — J3089 Other allergic rhinitis: Secondary | ICD-10-CM | POA: Diagnosis not present

## 2020-12-24 DIAGNOSIS — E669 Obesity, unspecified: Secondary | ICD-10-CM | POA: Diagnosis not present

## 2021-01-02 DIAGNOSIS — M47816 Spondylosis without myelopathy or radiculopathy, lumbar region: Secondary | ICD-10-CM | POA: Diagnosis not present

## 2021-01-02 DIAGNOSIS — M4316 Spondylolisthesis, lumbar region: Secondary | ICD-10-CM | POA: Diagnosis not present

## 2021-01-02 DIAGNOSIS — M40204 Unspecified kyphosis, thoracic region: Secondary | ICD-10-CM | POA: Diagnosis not present

## 2021-01-02 DIAGNOSIS — M4312 Spondylolisthesis, cervical region: Secondary | ICD-10-CM | POA: Diagnosis not present

## 2021-01-02 DIAGNOSIS — M4325 Fusion of spine, thoracolumbar region: Secondary | ICD-10-CM | POA: Diagnosis not present

## 2021-01-02 DIAGNOSIS — M47814 Spondylosis without myelopathy or radiculopathy, thoracic region: Secondary | ICD-10-CM | POA: Diagnosis not present

## 2021-01-02 DIAGNOSIS — M47812 Spondylosis without myelopathy or radiculopathy, cervical region: Secondary | ICD-10-CM | POA: Diagnosis not present

## 2021-01-02 DIAGNOSIS — Z981 Arthrodesis status: Secondary | ICD-10-CM | POA: Diagnosis not present

## 2021-01-07 DIAGNOSIS — J3081 Allergic rhinitis due to animal (cat) (dog) hair and dander: Secondary | ICD-10-CM | POA: Diagnosis not present

## 2021-01-07 DIAGNOSIS — J3089 Other allergic rhinitis: Secondary | ICD-10-CM | POA: Diagnosis not present

## 2021-01-14 DIAGNOSIS — J3089 Other allergic rhinitis: Secondary | ICD-10-CM | POA: Diagnosis not present

## 2021-01-14 DIAGNOSIS — J3081 Allergic rhinitis due to animal (cat) (dog) hair and dander: Secondary | ICD-10-CM | POA: Diagnosis not present

## 2021-01-21 DIAGNOSIS — E669 Obesity, unspecified: Secondary | ICD-10-CM | POA: Diagnosis not present

## 2021-01-23 DIAGNOSIS — J3081 Allergic rhinitis due to animal (cat) (dog) hair and dander: Secondary | ICD-10-CM | POA: Diagnosis not present

## 2021-01-23 DIAGNOSIS — J3089 Other allergic rhinitis: Secondary | ICD-10-CM | POA: Diagnosis not present

## 2021-01-30 DIAGNOSIS — J3081 Allergic rhinitis due to animal (cat) (dog) hair and dander: Secondary | ICD-10-CM | POA: Diagnosis not present

## 2021-01-30 DIAGNOSIS — J3089 Other allergic rhinitis: Secondary | ICD-10-CM | POA: Diagnosis not present

## 2021-02-13 DIAGNOSIS — J3081 Allergic rhinitis due to animal (cat) (dog) hair and dander: Secondary | ICD-10-CM | POA: Diagnosis not present

## 2021-02-13 DIAGNOSIS — J3089 Other allergic rhinitis: Secondary | ICD-10-CM | POA: Diagnosis not present

## 2021-02-18 DIAGNOSIS — E669 Obesity, unspecified: Secondary | ICD-10-CM | POA: Diagnosis not present

## 2021-02-20 DIAGNOSIS — J3089 Other allergic rhinitis: Secondary | ICD-10-CM | POA: Diagnosis not present

## 2021-02-20 DIAGNOSIS — J3081 Allergic rhinitis due to animal (cat) (dog) hair and dander: Secondary | ICD-10-CM | POA: Diagnosis not present

## 2021-02-27 DIAGNOSIS — J3081 Allergic rhinitis due to animal (cat) (dog) hair and dander: Secondary | ICD-10-CM | POA: Diagnosis not present

## 2021-02-27 DIAGNOSIS — J3089 Other allergic rhinitis: Secondary | ICD-10-CM | POA: Diagnosis not present

## 2021-03-04 DIAGNOSIS — J3081 Allergic rhinitis due to animal (cat) (dog) hair and dander: Secondary | ICD-10-CM | POA: Diagnosis not present

## 2021-03-04 DIAGNOSIS — J3089 Other allergic rhinitis: Secondary | ICD-10-CM | POA: Diagnosis not present

## 2021-03-12 DIAGNOSIS — J4520 Mild intermittent asthma, uncomplicated: Secondary | ICD-10-CM | POA: Diagnosis not present

## 2021-03-12 DIAGNOSIS — F419 Anxiety disorder, unspecified: Secondary | ICD-10-CM | POA: Diagnosis not present

## 2021-03-12 DIAGNOSIS — M25512 Pain in left shoulder: Secondary | ICD-10-CM | POA: Diagnosis not present

## 2021-03-12 DIAGNOSIS — I10 Essential (primary) hypertension: Secondary | ICD-10-CM | POA: Diagnosis not present

## 2021-03-12 DIAGNOSIS — R296 Repeated falls: Secondary | ICD-10-CM | POA: Diagnosis not present

## 2021-03-13 DIAGNOSIS — J3081 Allergic rhinitis due to animal (cat) (dog) hair and dander: Secondary | ICD-10-CM | POA: Diagnosis not present

## 2021-03-13 DIAGNOSIS — J3089 Other allergic rhinitis: Secondary | ICD-10-CM | POA: Diagnosis not present

## 2021-03-20 DIAGNOSIS — J3081 Allergic rhinitis due to animal (cat) (dog) hair and dander: Secondary | ICD-10-CM | POA: Diagnosis not present

## 2021-03-20 DIAGNOSIS — J3089 Other allergic rhinitis: Secondary | ICD-10-CM | POA: Diagnosis not present

## 2021-03-25 DIAGNOSIS — E669 Obesity, unspecified: Secondary | ICD-10-CM | POA: Diagnosis not present

## 2021-04-01 DIAGNOSIS — J3081 Allergic rhinitis due to animal (cat) (dog) hair and dander: Secondary | ICD-10-CM | POA: Diagnosis not present

## 2021-04-01 DIAGNOSIS — J3089 Other allergic rhinitis: Secondary | ICD-10-CM | POA: Diagnosis not present

## 2021-04-10 DIAGNOSIS — J3089 Other allergic rhinitis: Secondary | ICD-10-CM | POA: Diagnosis not present

## 2021-04-10 DIAGNOSIS — J3081 Allergic rhinitis due to animal (cat) (dog) hair and dander: Secondary | ICD-10-CM | POA: Diagnosis not present

## 2021-04-15 DIAGNOSIS — J3089 Other allergic rhinitis: Secondary | ICD-10-CM | POA: Diagnosis not present

## 2021-04-15 DIAGNOSIS — F419 Anxiety disorder, unspecified: Secondary | ICD-10-CM | POA: Diagnosis not present

## 2021-04-15 DIAGNOSIS — J3081 Allergic rhinitis due to animal (cat) (dog) hair and dander: Secondary | ICD-10-CM | POA: Diagnosis not present

## 2021-04-15 DIAGNOSIS — E669 Obesity, unspecified: Secondary | ICD-10-CM | POA: Diagnosis not present

## 2021-04-24 DIAGNOSIS — J3081 Allergic rhinitis due to animal (cat) (dog) hair and dander: Secondary | ICD-10-CM | POA: Diagnosis not present

## 2021-04-24 DIAGNOSIS — J3089 Other allergic rhinitis: Secondary | ICD-10-CM | POA: Diagnosis not present

## 2021-04-29 DIAGNOSIS — J3081 Allergic rhinitis due to animal (cat) (dog) hair and dander: Secondary | ICD-10-CM | POA: Diagnosis not present

## 2021-04-29 DIAGNOSIS — J3089 Other allergic rhinitis: Secondary | ICD-10-CM | POA: Diagnosis not present

## 2021-05-06 DIAGNOSIS — J3081 Allergic rhinitis due to animal (cat) (dog) hair and dander: Secondary | ICD-10-CM | POA: Diagnosis not present

## 2021-05-06 DIAGNOSIS — J3089 Other allergic rhinitis: Secondary | ICD-10-CM | POA: Diagnosis not present

## 2021-05-27 DIAGNOSIS — E669 Obesity, unspecified: Secondary | ICD-10-CM | POA: Diagnosis not present

## 2021-05-29 DIAGNOSIS — M48061 Spinal stenosis, lumbar region without neurogenic claudication: Secondary | ICD-10-CM | POA: Diagnosis not present

## 2021-05-29 DIAGNOSIS — M533 Sacrococcygeal disorders, not elsewhere classified: Secondary | ICD-10-CM | POA: Diagnosis not present

## 2021-05-29 DIAGNOSIS — Z981 Arthrodesis status: Secondary | ICD-10-CM | POA: Diagnosis not present

## 2021-05-29 DIAGNOSIS — M4804 Spinal stenosis, thoracic region: Secondary | ICD-10-CM | POA: Diagnosis not present

## 2021-05-29 DIAGNOSIS — M4325 Fusion of spine, thoracolumbar region: Secondary | ICD-10-CM | POA: Diagnosis not present

## 2021-05-29 DIAGNOSIS — G8929 Other chronic pain: Secondary | ICD-10-CM | POA: Diagnosis not present

## 2021-06-05 DIAGNOSIS — J3081 Allergic rhinitis due to animal (cat) (dog) hair and dander: Secondary | ICD-10-CM | POA: Diagnosis not present

## 2021-06-05 DIAGNOSIS — J3089 Other allergic rhinitis: Secondary | ICD-10-CM | POA: Diagnosis not present

## 2021-06-10 DIAGNOSIS — J3081 Allergic rhinitis due to animal (cat) (dog) hair and dander: Secondary | ICD-10-CM | POA: Diagnosis not present

## 2021-06-10 DIAGNOSIS — J3089 Other allergic rhinitis: Secondary | ICD-10-CM | POA: Diagnosis not present

## 2021-06-24 DIAGNOSIS — J3089 Other allergic rhinitis: Secondary | ICD-10-CM | POA: Diagnosis not present

## 2021-06-24 DIAGNOSIS — J3081 Allergic rhinitis due to animal (cat) (dog) hair and dander: Secondary | ICD-10-CM | POA: Diagnosis not present

## 2021-06-27 DIAGNOSIS — F419 Anxiety disorder, unspecified: Secondary | ICD-10-CM | POA: Diagnosis not present

## 2024-03-13 ENCOUNTER — Ambulatory Visit

## 2024-03-13 DIAGNOSIS — L814 Other melanin hyperpigmentation: Secondary | ICD-10-CM

## 2024-03-13 DIAGNOSIS — L821 Other seborrheic keratosis: Secondary | ICD-10-CM

## 2024-03-13 DIAGNOSIS — L720 Epidermal cyst: Secondary | ICD-10-CM

## 2024-03-13 DIAGNOSIS — L820 Inflamed seborrheic keratosis: Secondary | ICD-10-CM

## 2024-03-13 NOTE — Progress Notes (Signed)
   New Patient Visit   Subjective  Maureen Wiggins is a 68 y.o. female who presents for the following: Patient reports 2 areas of concern on her face and 2 on her chest. States these are itchy and bothersome. She also states that she has a place on her left leg that wont heal that she would like checked today.     The following portions of the chart were reviewed this encounter and updated as appropriate: medications, allergies, medical history  Review of Systems:  No other skin or systemic complaints except as noted in HPI or Assessment and Plan.  Objective  Well appearing patient in no apparent distress; mood and affect are within normal limits.   A focused examination was performed of the following areas: Face, chest, leg  Relevant exam findings are noted in the Assessment and Plan.   Left Lower Leg - Anterior Stuck on waxy paps with erythema  Assessment & Plan   LENTIGINES on face Exam: scattered tan macules Due to sun exposure Treatment Plan: Benign-appearing, observe. Recommend daily broad spectrum sunscreen SPF 30+ to sun-exposed areas, reapply every 2 hours as needed.  Call for any changes  MILIA on the chest and face - symptomatic and bothersome to patient  Exam: tiny erythematous firm white papule  Discussed this is a type of cyst. Benign-appearing. Sometimes these will clear with OTC adapalene/Differin 0.1% cream QHS or retinol.  Discussed extraction if symptomatic.  Treatment Plan: Symptomatic, irritating, patient would like treated.   Benign Destruction procedure for symptomatic milia/cysts: Procedure risks and benefits were discussed with the patient (bleeding, bruising, small scar) and verbal consent was obtained. Following alcohol prep of the skin,  Sharp destruction performed with 11 blade incision and firm pressure with comedone extractor.  Total # lesions destroyed, 3.  Vaseline ointment was applied to each site. The patient tolerated the procedure  well.   SEBORRHEIC KERATOSIS left lower leg - Stuck-on, waxy, tan-brown papules and/or plaques  - Benign-appearing - Discussed benign etiology and prognosis. - Observe - Call for any changes    INFLAMED SEBORRHEIC KERATOSIS Left Lower Leg - Anterior Symptomatic, irritating, patient would like treated. Destruction of lesion - Left Lower Leg - Anterior Complexity: simple   Destruction method: cryotherapy   Informed consent: discussed and consent obtained   Timeout:  patient name, date of birth, surgical site, and procedure verified Lesion destroyed using liquid nitrogen: Yes   Region frozen until ice ball extended beyond lesion: Yes   Cryo cycles: 1 or 2. Outcome: patient tolerated procedure well with no complications   Post-procedure details: wound care instructions given   Additional details:  Prior to procedure, discussed risks of blister formation, small wound, skin dyspigmentation, or rare scar following cryotherapy. Recommend Vaseline ointment to treated areas while healing.   LENTIGO   MILIA   SEBORRHEIC KERATOSIS    Return if symptoms worsen or fail to improve.  I, Emerick Ege, CMA am acting as scribe for Lauraine JAYSON Kanaris, MD.   Documentation: I have reviewed the above documentation for accuracy and completeness, and I agree with the above.  Lauraine JAYSON Kanaris, MD

## 2024-03-13 NOTE — Patient Instructions (Addendum)
 Cryotherapy Aftercare  Wash gently with soap and water everyday.   Apply Vaseline and Band-Aid daily until healed.    Gentle Skin Care Guide  1. Bathe no more than once a day.  2. Avoid bathing in hot water  3. Use a mild soap like Dove, Vanicream, Cetaphil, CeraVe. Can use Lever 2000 or Cetaphil antibacterial soap  4. Use soap only where you need it. On most days, use it under your arms, between your legs, and on your feet. Let the water rinse other areas unless visibly dirty.  5. When you get out of the bath/shower, use a towel to gently blot your skin dry, don't rub it.  6. While your skin is still a little damp, apply a moisturizing cream such as Vanicream, CeraVe, Cetaphil, Eucerin, Sarna lotion or plain Vaseline Jelly. For hands apply Neutrogena [place] Hand Cream or Excipial Hand Cream.  7. Reapply moisturizer any time you start to itch or feel dry.  8. Sometimes using free and clear laundry detergents can be helpful. Fabric softener sheets should be avoided. Downy Free & Gentle liquid, or any liquid fabric softener that is free of dyes and perfumes, it acceptable to use  9. If your doctor has given you prescription creams you may apply moisturizers over them    Due to recent changes in healthcare laws, you may see results of your pathology and/or laboratory studies on MyChart before the doctors have had a chance to review them. We understand that in some cases there may be results that are confusing or concerning to you. Please understand that not all results are received at the same time and often the doctors may need to interpret multiple results in order to provide you with the best plan of care or course of treatment. Therefore, we ask that you please give us  2 business days to thoroughly review all your results before contacting the office for clarification. Should we see a critical lab result, you will be contacted sooner.   If You Need Anything After Your Visit  If  you have any questions or concerns for your doctor, please call our main line at [phone] and press option 4 to reach your doctor's medical assistant. If no one answers, please leave a voicemail as directed and we will return your call as soon as possible. Messages left after 4 pm will be answered the following business day.   You may also send us  a message via MyChart. We typically respond to MyChart messages within 1-2 business days.  For prescription refills, please ask your pharmacy to contact our office. Our fax number is [phone].  If you have an urgent issue when the clinic is closed that cannot wait until the next business day, you can page your doctor at the number below.    Please note that while we do our best to be available for urgent issues outside of office hours, we are not available 24/7.   If you have an urgent issue and are unable to reach us , you may choose to seek medical care at your doctor's office, retail clinic, urgent care center, or emergency room.  If you have a medical emergency, please immediately call 911 or go to the emergency department.  Pager Numbers  - Dr. Hester: [phone]  - Dr. Jackquline: [phone]  - Dr. Claudene: [phone]   - Dr. Raymund: [phone]  In the event of inclement weather, please call our main line at [phone] for an update on the status of any  delays or closures.  Dermatology Medication Tips: Please keep the boxes that topical medications come in in order to help keep track of the instructions about where and how to use these. Pharmacies typically print the medication instructions only on the boxes and not directly on the medication tubes.   If your medication is too expensive, please contact our office at [phone] option 4 or send us  a message through MyChart.   We are unable to tell what your co-pay for medications will be in advance as this is different depending on your insurance coverage. However, we may  be able to find a substitute medication at lower cost or fill out paperwork to get insurance to cover a needed medication.   If a prior authorization is required to get your medication covered by your insurance company, please allow us  1-2 business days to complete this process.  Drug prices often vary depending on where the prescription is filled and some pharmacies may offer cheaper prices.  The website www.goodrx.com contains coupons for medications through different pharmacies. The prices here do not account for what the cost may be with help from insurance (it may be cheaper with your insurance), but the website can give you the price if you did not use any insurance.  - You can print the associated coupon and take it with your prescription to the pharmacy.  - You may also stop by our office during regular business hours and pick up a GoodRx coupon card.  - If you need your prescription sent electronically to a different pharmacy, notify our office through [facility] or by phone at [phone] option 4.     Si Usted Necesita Algo Despus de Su Visita  Tambin puede enviarnos un mensaje a travs de Clinical cytogeneticist. Por lo general respondemos a los mensajes de MyChart en el transcurso de 1 a 2 das hbiles.  Para renovar recetas, por favor pida a su farmacia que se ponga en contacto con nuestra oficina. Randi lakes de fax es [place] [phone].  Si tiene un asunto urgente cuando la clnica est cerrada y que no puede esperar hasta el siguiente da hbil, puede llamar/localizar a su doctor(a) al nmero que aparece a continuacin.   Por favor, tenga en cuenta que aunque hacemos todo lo posible para estar disponibles para asuntos urgentes fuera del horario de [place], no estamos disponibles las 24 horas del da, los 7 [address]  [address] de la [place].   Si tiene un problema urgente y no puede comunicarse con nosotros, puede optar por buscar atencin mdica  en el consultorio de su doctor(a), en una  clnica privada, en un centro de atencin urgente o en una sala de emergencias.  Si tiene Engineer, drilling, por favor llame inmediatamente al 911 o vaya a la sala de emergencias.  Nmeros de bper  - Dr. Hester: [phone]  - Dra. Jackquline: [phone]  - Dr. Claudene: [phone]  - Dra. Kitts: [phone]  En caso de inclemencias del [place], por favor llame a nuestra lnea principal al [phone] para una actualizacin sobre el estado de cualquier retraso o cierre.  Consejos para la medicacin en dermatologa: Por favor, guarde las cajas en las que vienen los medicamentos de uso tpico para ayudarle a seguir las instrucciones sobre dnde y cmo usarlos. Las farmacias generalmente imprimen las instrucciones del medicamento slo en las cajas y no directamente en los tubos del [place].   Si su medicamento es muy caro, por favor, pngase en contacto con nuestra oficina llamando al  [phone] y presione la opcin 4 o envenos un mensaje a travs de Clinical cytogeneticist.   No podemos decirle cul ser su copago por los medicamentos por adelantado ya que esto es diferente dependiendo de la cobertura de su seguro. Sin embargo, es posible que podamos encontrar un medicamento sustituto a Audiological scientist un formulario para que el seguro cubra el medicamento que se considera necesario.   Si se requiere una autorizacin previa para que su compaa de seguros malta su medicamento, por favor permtanos de 1 a 2 das hbiles para completar este proceso.  Los precios de los medicamentos varan con frecuencia dependiendo del Environmental consultant de dnde se surte la receta y alguna farmacias pueden ofrecer precios ms baratos.  El sitio web www.goodrx.com tiene cupones para medicamentos de Health and safety inspector. Los precios aqu no tienen en cuenta lo que podra costar con la ayuda del seguro (puede ser ms barato con su seguro), pero el sitio web puede darle el precio si no utiliz Tourist information centre manager.  - Puede  imprimir el cupn correspondiente y llevarlo con su receta a la farmacia.  - Tambin puede pasar por nuestra oficina durante el horario de atencin regular y Education officer, museum una tarjeta de cupones de GoodRx.  - Si necesita que su receta se enve electrnicamente a una farmacia diferente, informe a nuestra oficina a travs de MyChart de [place] o por telfono llamando al [phone] y presione la opcin 4.

## 2025-03-14 ENCOUNTER — Ambulatory Visit
